# Patient Record
Sex: Female | Born: 1975
Health system: Midwestern US, Academic
[De-identification: ages and names within clinical notes are randomized; demographics above are authoritative.]

---

## 2018-11-28 ENCOUNTER — Encounter: Admit: 2018-11-28 | Discharge: 2018-11-28

## 2018-11-28 NOTE — Telephone Encounter
Introduction:  Will you need interpreter services? No  What prompted you to look into living donation? family friend  What is your relationship to the recipient? friend  Is your family aware of your consideration of living donation? Yes  Do you feel your family is supportive of your consideration of living donation? Yes  Is the recipient aware that you are considering living donation? Yes  If not, do you wish to remain anonymous? No  -Discuss confidentiality process in that is a best effort, but no guarantees-      Race, Sex and Body Habitus:  What race do you identify as? white  What gender do you identify with? female  What gender was assigned to you at birth? female  What is your Height? 5 ' 7  What is your weight? 230  Calculated BMI:  36    To proceed forward with evaluation: 35 or less  If approved to be donor, BMI of 30 or less    Patient will call back when BMI < 35

## 2019-04-21 IMAGING — CT Abdomen^ABD_PEL_W (Adult)
1 series · 15 of 32 positions shown, 19 images · IV contrast (OPTIRAY)
Comparison: none

Procedure(s): CT abdomen pelvis w con

EXAM:  CT of the abdomen and pelvis with 100 cc of Optiray
350 contrast.
INDICATIONS: Epigastric abdominal pain. Pancreatitis.
DISCLAIMER: Radiation reduction techniques were performed to
reduce the radiation dose as low as reasonably achievable
(PINKY).

[Series 2: abd-pel_w · axial · 0.98mm/px · z∈[+402,+847]mm · 15 of 100 slices shown, 19 images]
[im 7/100  soft-tissue]
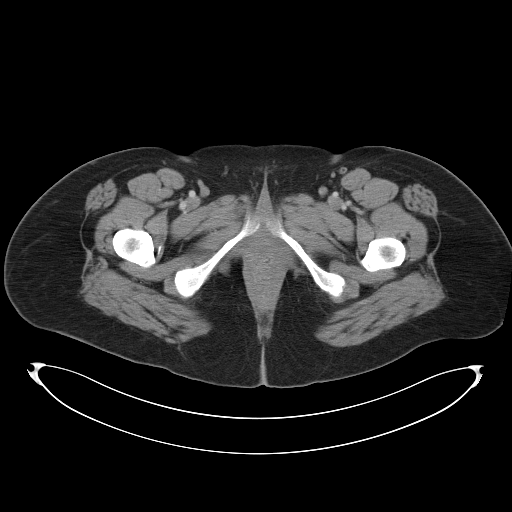
[im 7/100  bone]
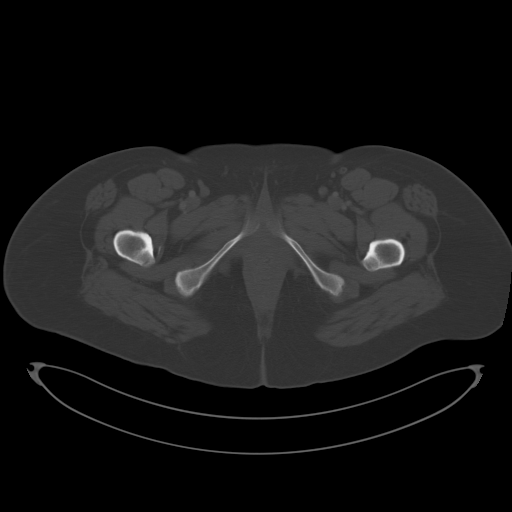
[im 13/100  soft-tissue]
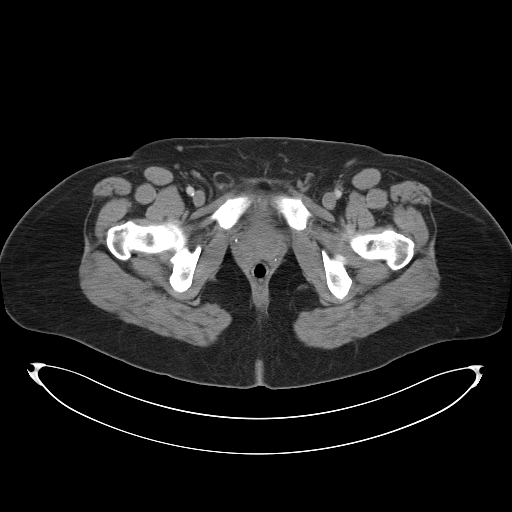
[im 20/100  soft-tissue]
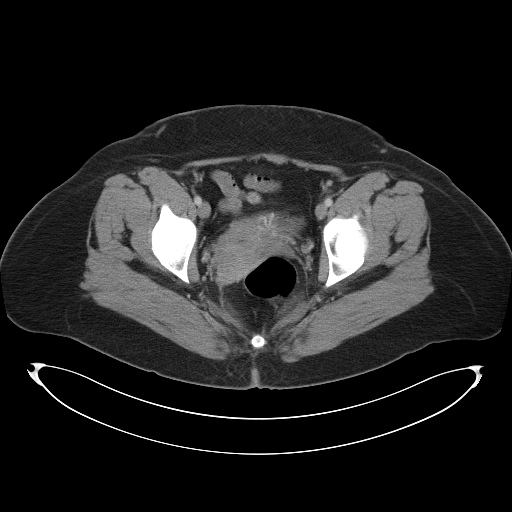
[im 29/100  soft-tissue]
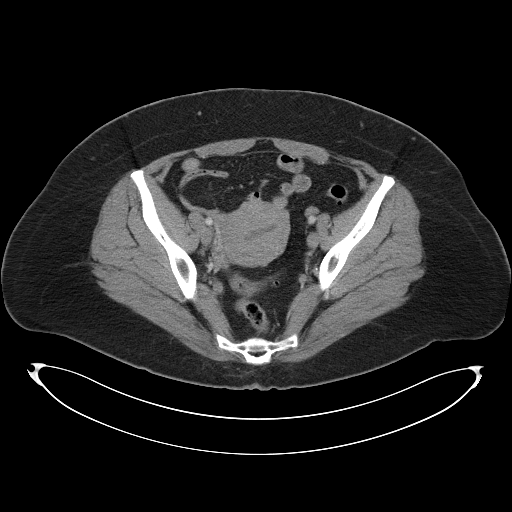
[im 36/100  soft-tissue]
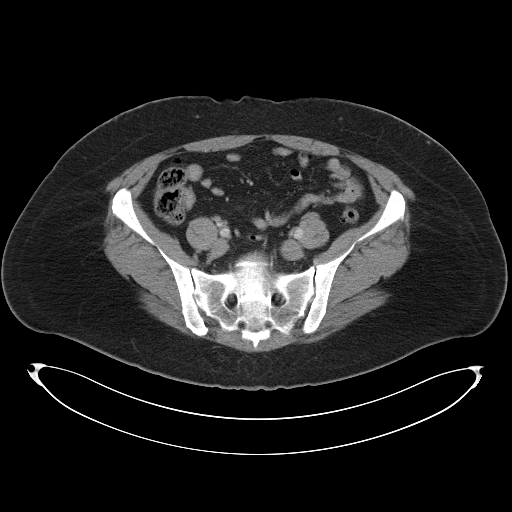
[im 42/100  soft-tissue]
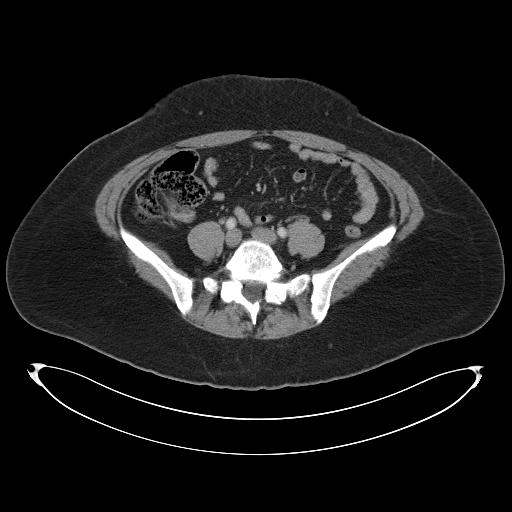
[im 52/100  soft-tissue]
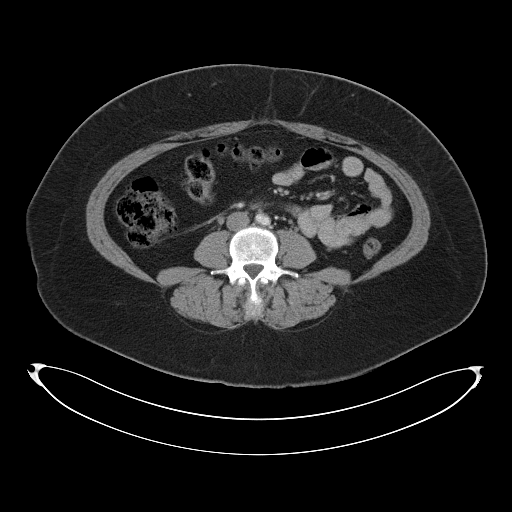
[im 58/100  soft-tissue]
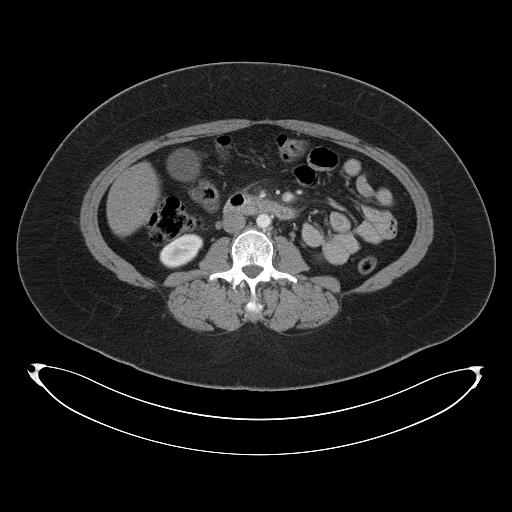
[im 64/100  soft-tissue]
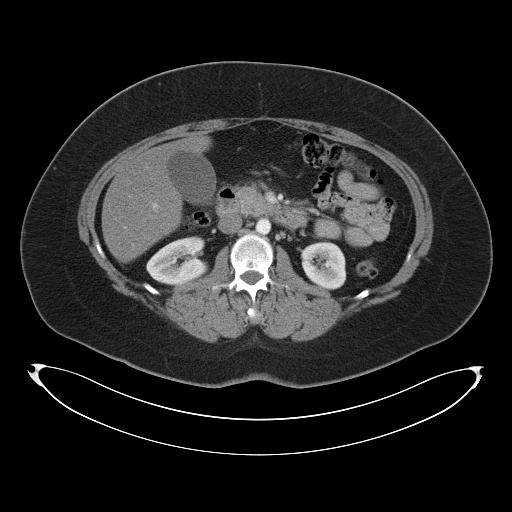
[im 64/100  bone]
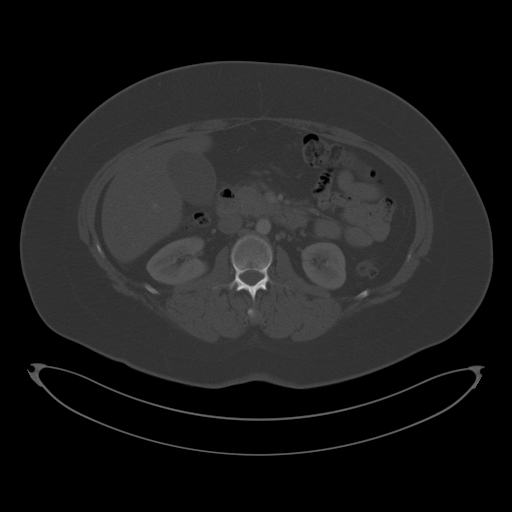
[im 71/100  soft-tissue]
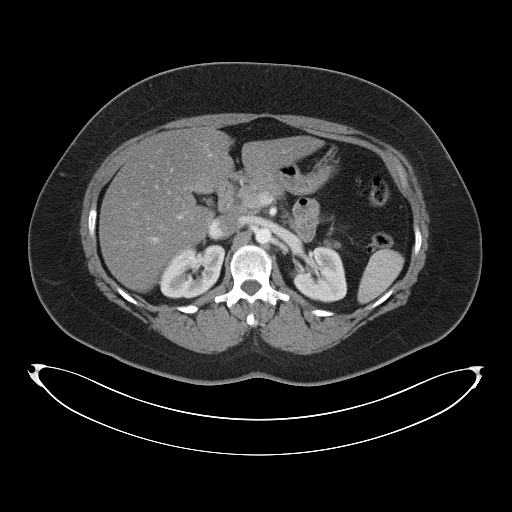
[im 80/100  soft-tissue]
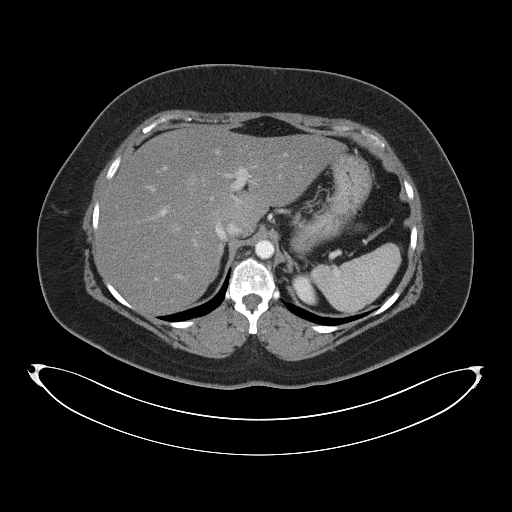
[im 87/100  soft-tissue]
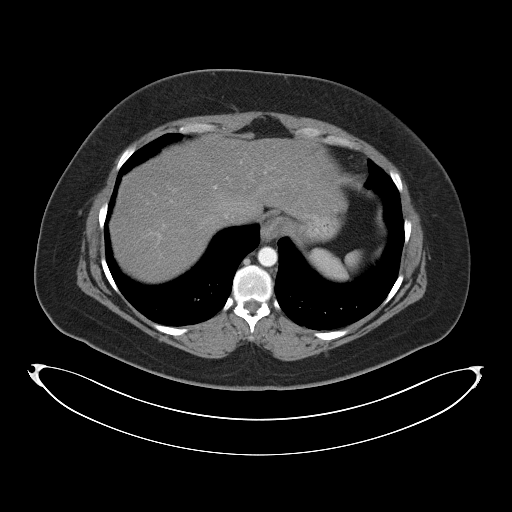
[im 87/100  lung]
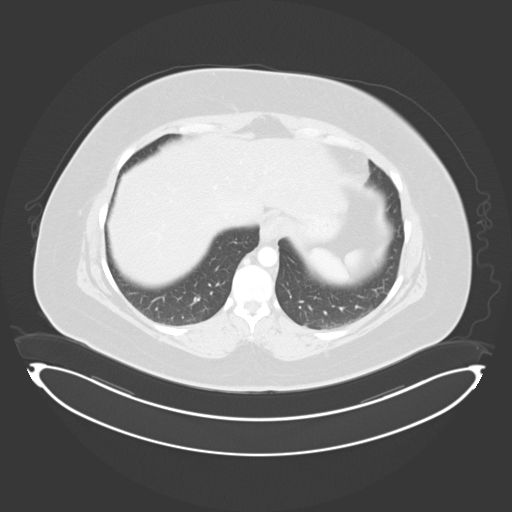
[im 90/100  lung]
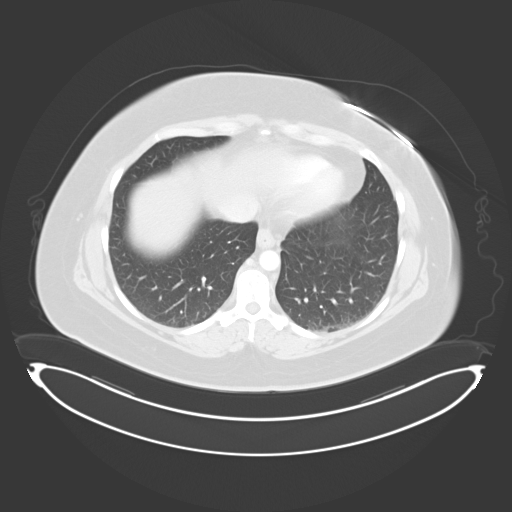
[im 93/100  soft-tissue]
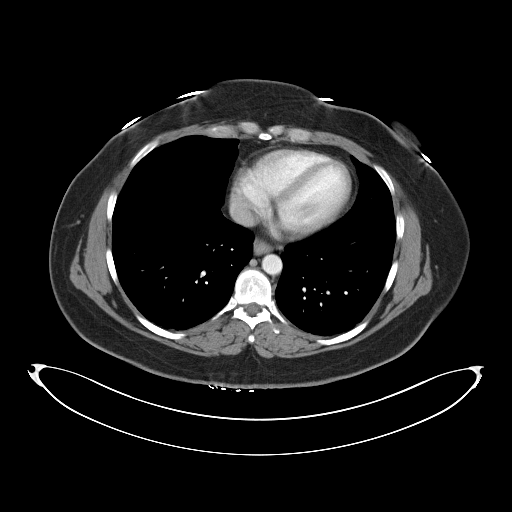
[im 93/100  lung]
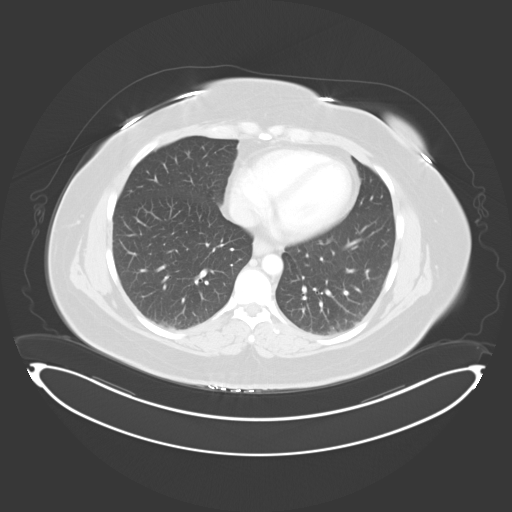
[im 96/100  lung]
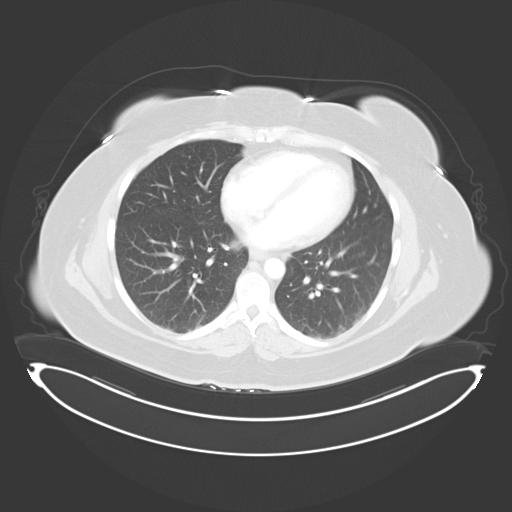

[15 of 32 positions shown; findings below may reference images not displayed]

FINDINGS: Comparison 12/22/2016

Lung bases: The lung bases are clear.

Liver: Hepatic steatosis. No focal lesions are identified.

Gallbladder: The gallbladder is unremarkable. There is no
bile duct dilatation.

Spleen: The spleen is normal in size without focal lesions.

Midline structures: There is mild fat stranding along the
pancreatic head and uncinate process of the pancreas
consistent with pancreatitis. No lesions are identified. The
stomach is grossly unremarkable.

Kidneys: The kidneys are normal in size. There are no solid
masses or hydronephrosis.

Aorta and central retroperitoneum: There is mild
atheromatous plaquing of the distal aorta. No central
retroperitoneal adenopathy is identified.

Pelvic structures: The uterus is normal in size. There is a
1.5 cm right ovarian cyst. No solid adnexal masses are
present.

Small bowel: The small bowel is unremarkable. There is no
bowel wall thickening or mechanical bowel obstruction.

Large bowel: There is sigmoid diverticulosis without
diverticulitis.

Appendix: The appendix is visualized and is grossly
unremarkable.

Extra: There is no free fluid, free air, or abscess.

Bones: There is degenerative disc disease at L5-S1.
IMPRESSION: Acute pancreatitis. There is no bile duct dilatation,
pseudocyst formation or splenic vein thrombosis.

Hepatic steatosis, unchanged from the prior study.

## 2019-04-24 IMAGING — US ABDLM
1 series · 14 of 24 positions shown · non-contrast
Comparison: none

Procedure(s): US abdomen [id number]

ULTRASOUND ABDOMEN LIMITED
CLINICAL DATA: Right upper quadrant pain.

[Series 1: us abdomen (id) · 14 of 24 slices shown]
[im 1/24]
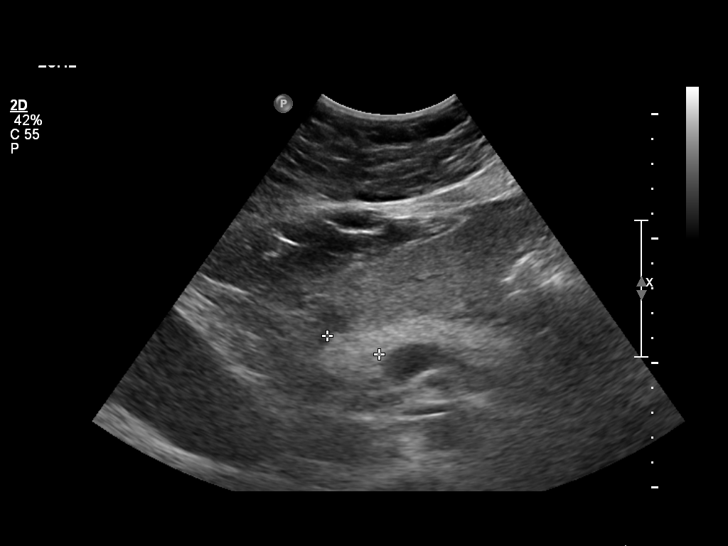
[im 3/24]
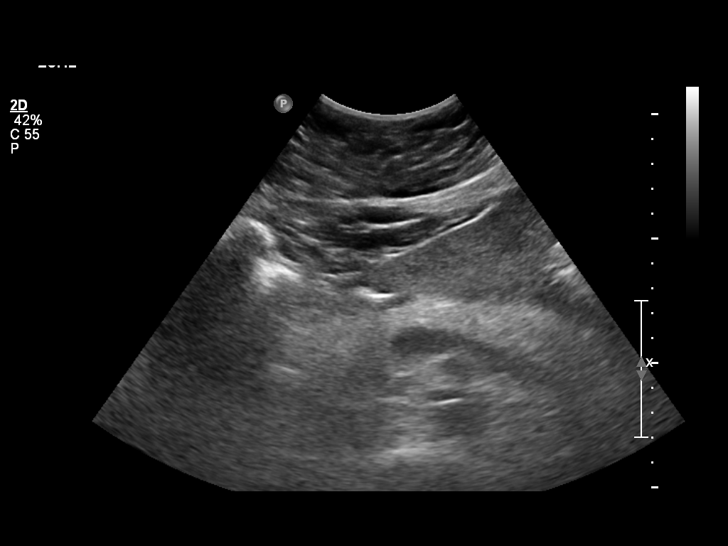
[im 5/24]
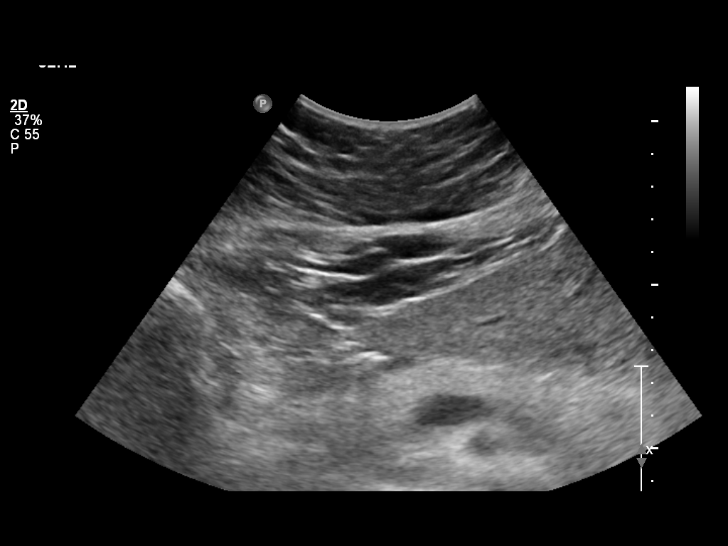
[im 7/24]
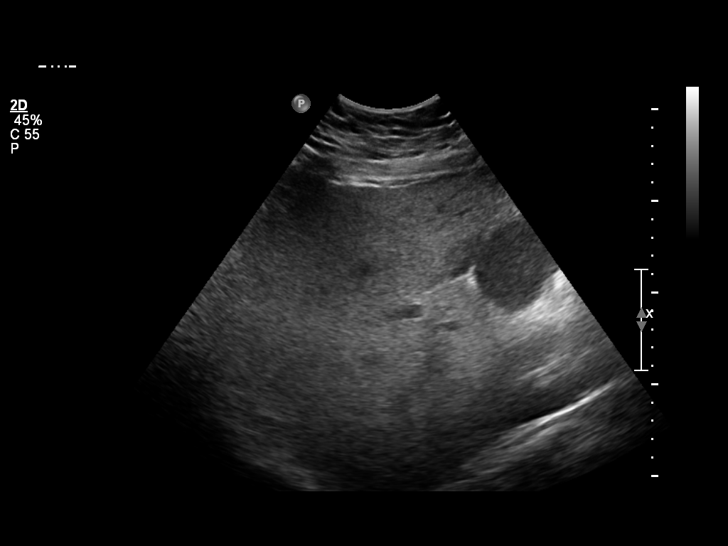
[im 8/24]
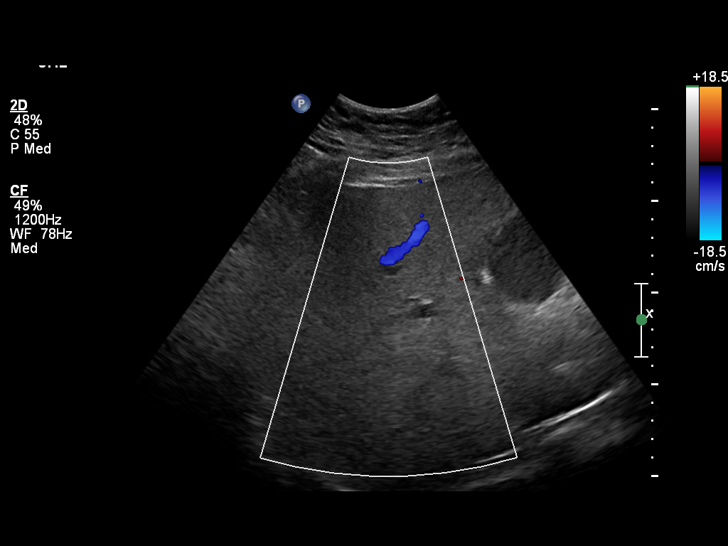
[im 10/24]
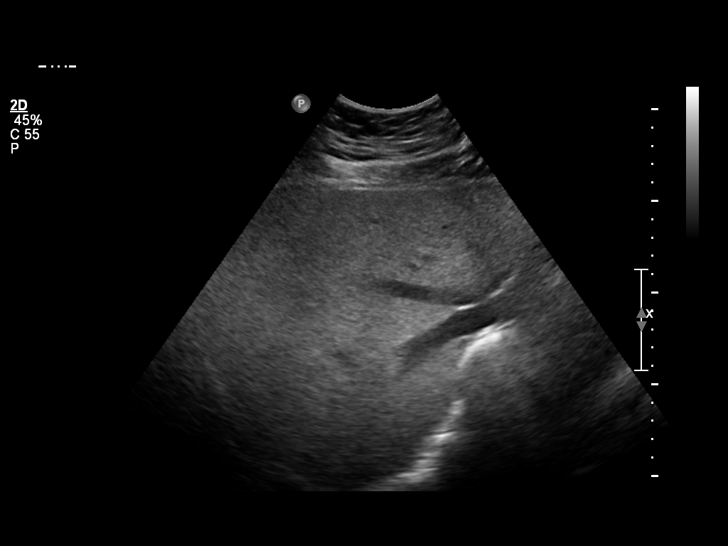
[im 12/24]
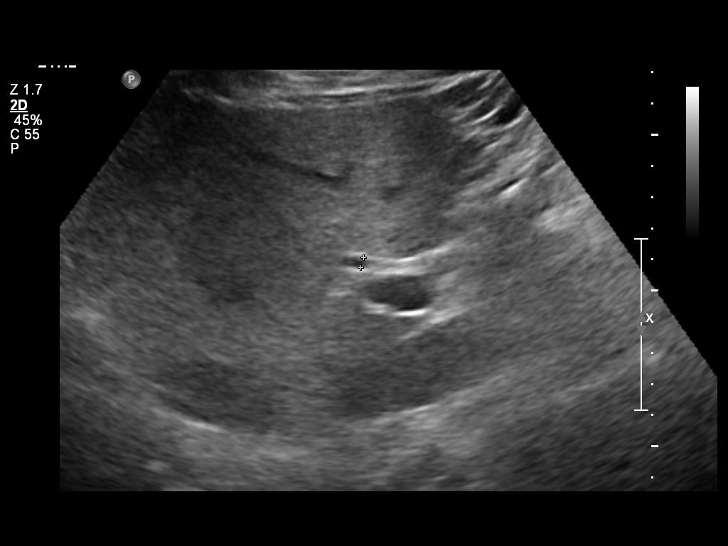
[im 13/24]
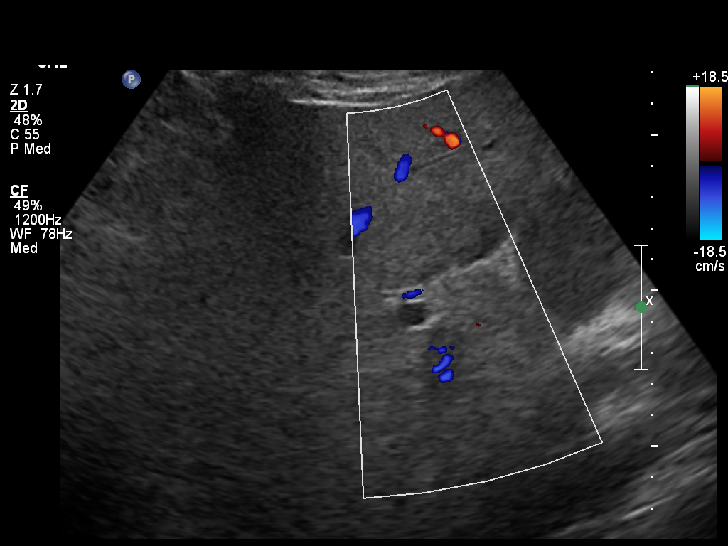
[im 15/24]
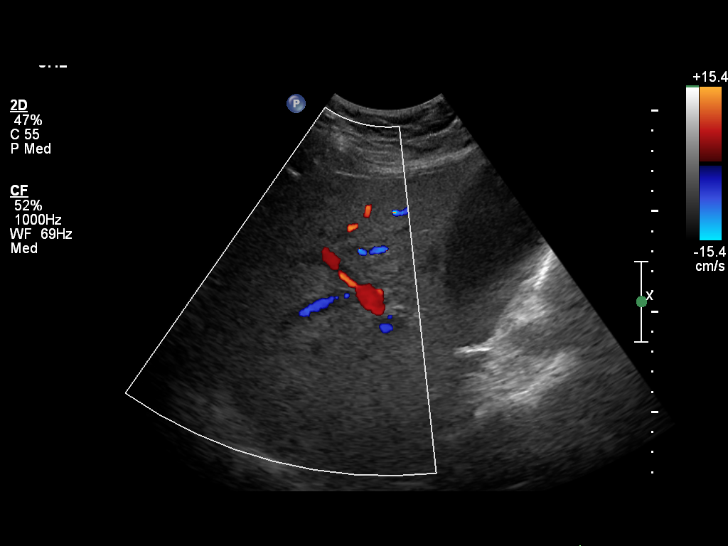
[im 17/24]
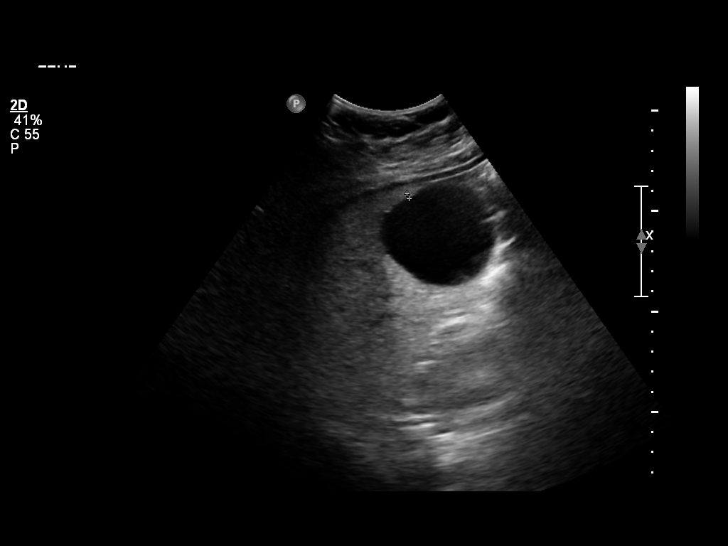
[im 19/24]
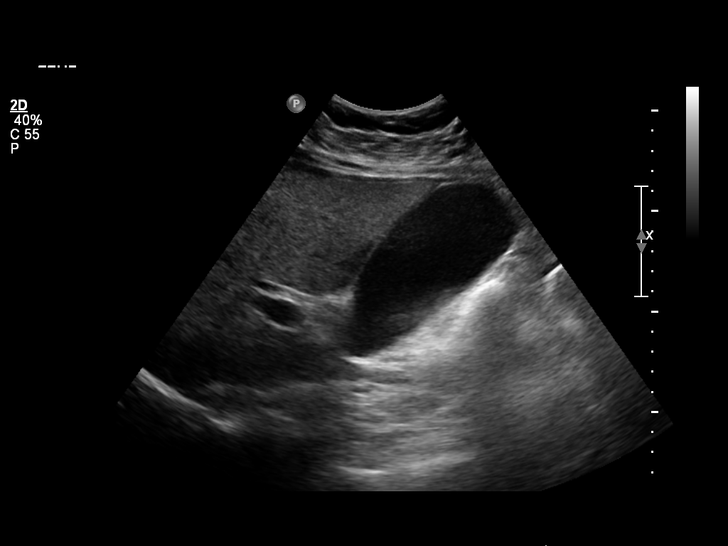
[im 20/24]
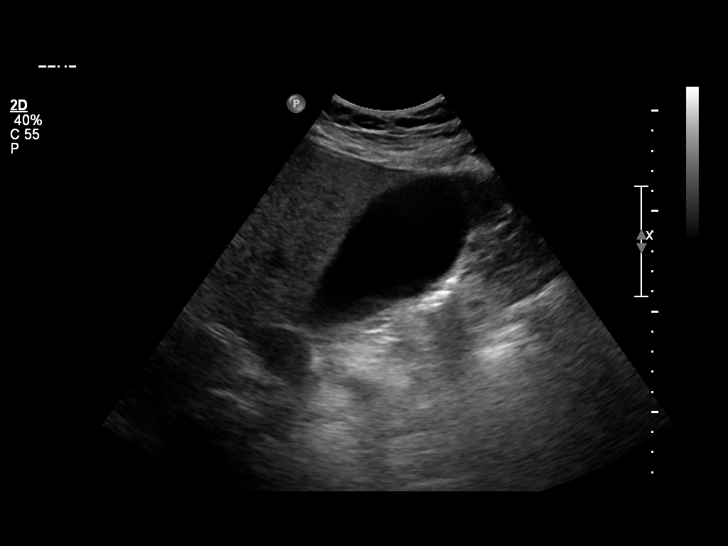
[im 22/24]
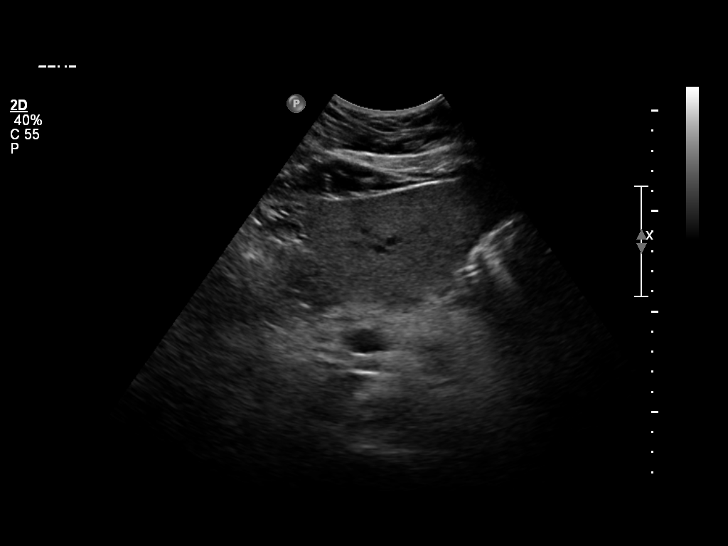
[im 24/24]
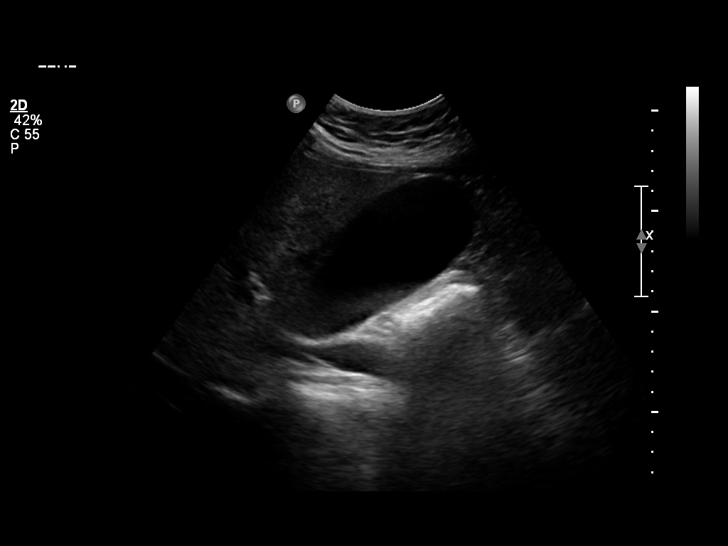

[14 of 24 positions shown; findings below may reference images not displayed]

FINDINGS: The liver is enlarged measuring 18.3 cm in length, no
evidence of hepatic lesions. The gallbladder has normal size
and wall thickness.  No gallstones are present. Mild probe
tenderness is elicited.  The common duct measures 3.3 mm.
The pancreas is unremarkable.
IMPRESSION: 1. Mild hepatomegaly.
2. Mild tenderness during gallbladder examination, no
evidence of cholelithiasis, normal gallbladder wall
thickness.

## 2020-01-01 IMAGING — DX C-Spine
3 series · 3 of 3 positions shown · non-contrast
Comparison: none

Procedure(s): XR cervical spine 3V

THREE VIEWS OF THE CERVICAL SPINE:
CLINICAL HISTORY: Pain.

[left lateral]
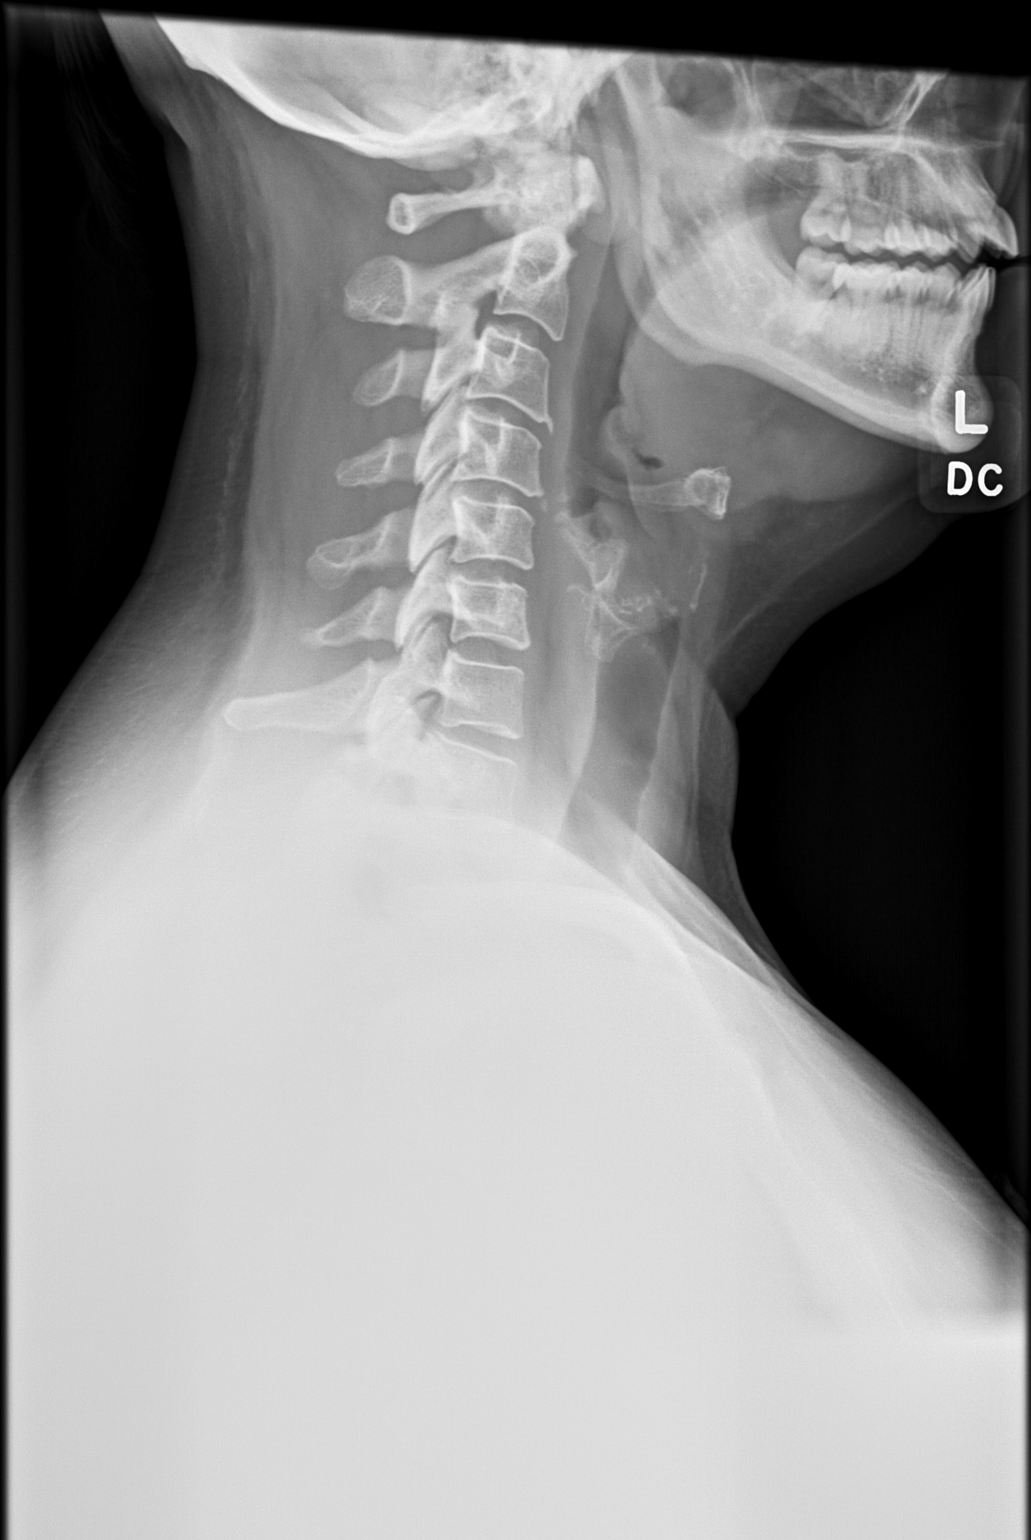

[AP (1 of 2)]
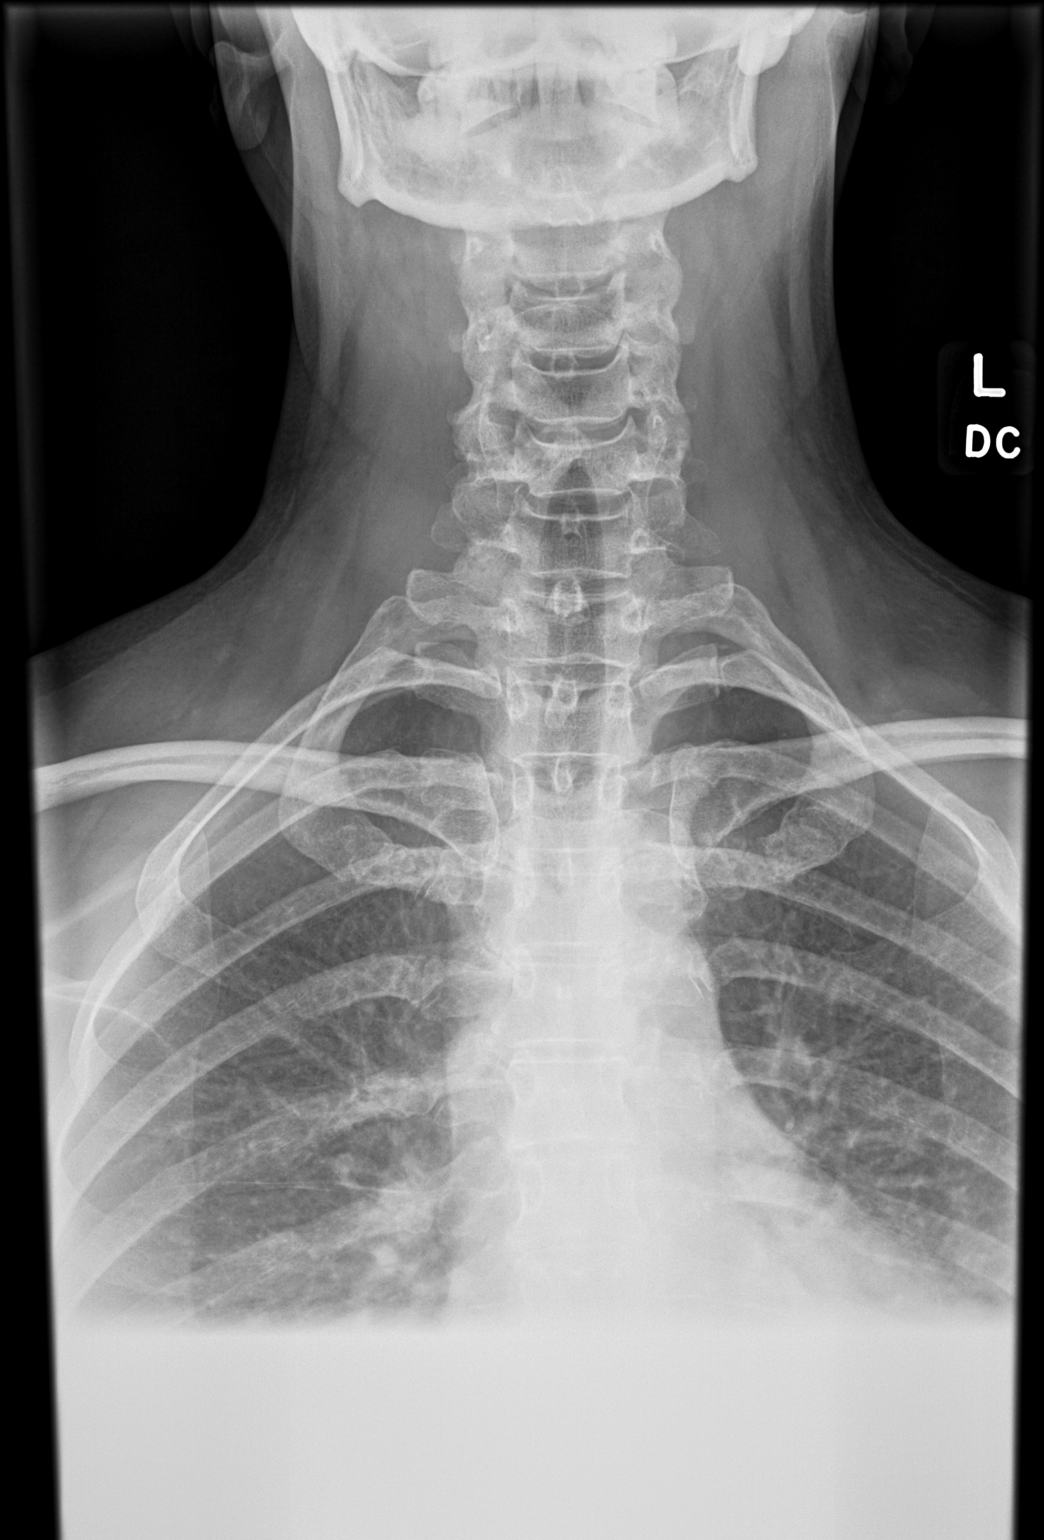

[AP (2 of 2)]
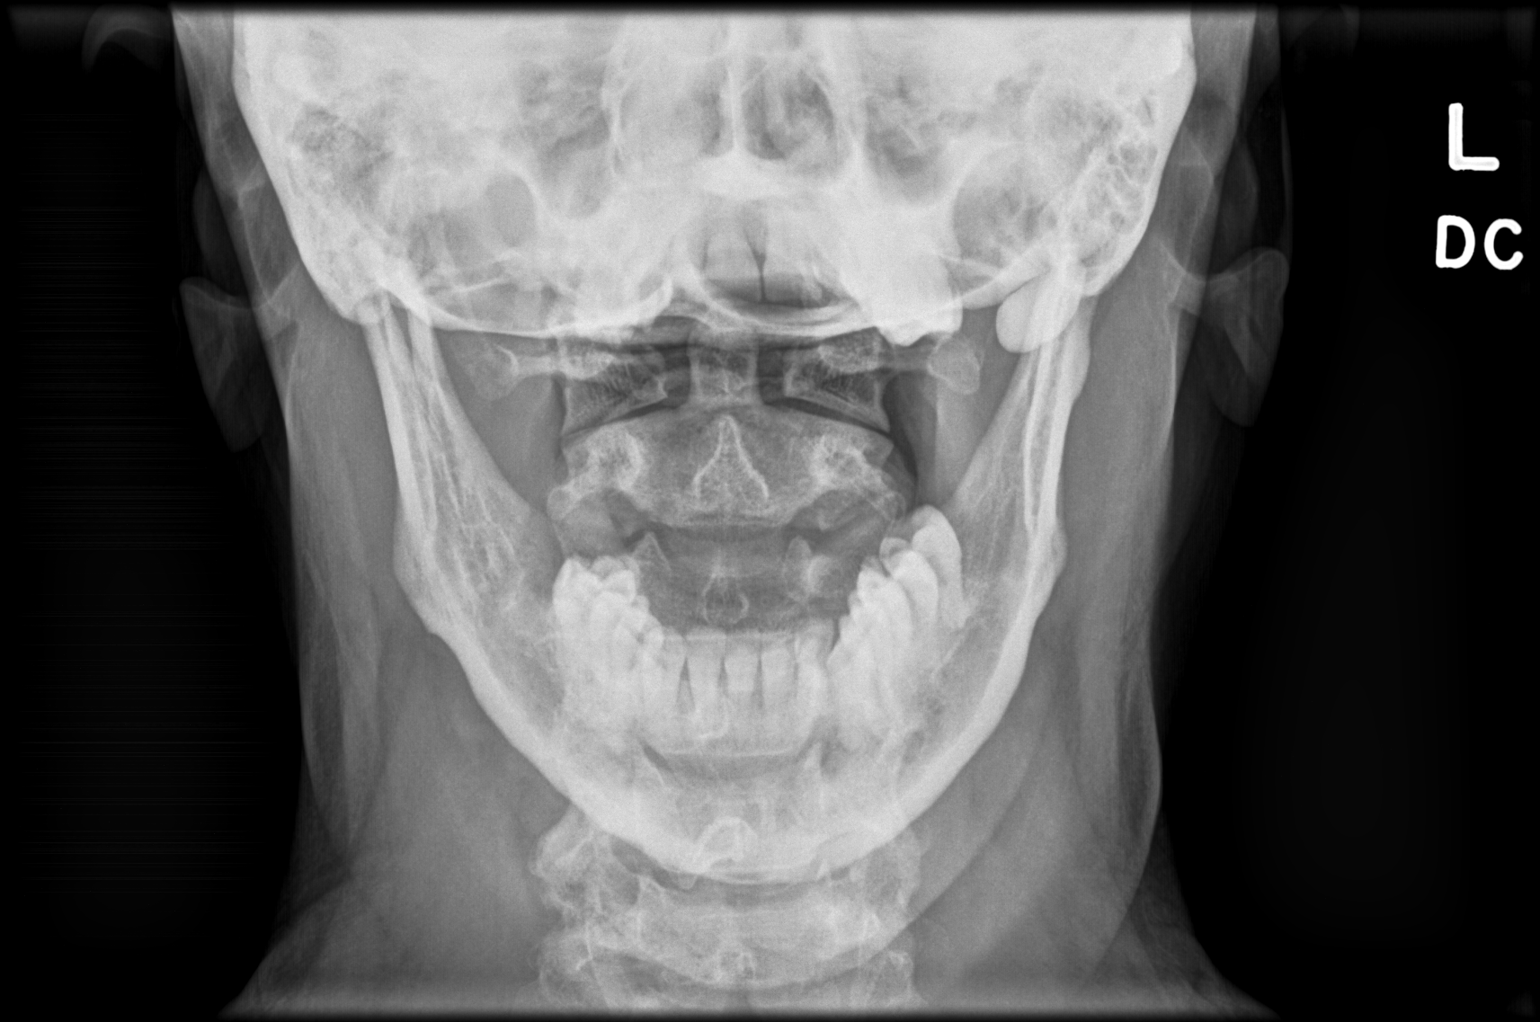

[3 of 3 positions shown; findings below may reference images not displayed]

FINDINGS: The cervical vertebral body heights are maintained. There is
reversal of the cervical curvature. There are developing
ventral osteophytes at C3-C4 and C4-C5. The lateral masses
are aligned. The dens appears intact.
IMPRESSION: 1. Reversal of the cervical curvature and associated
anterior degenerative disc disease at C3-C4 and C4-C5.

## 2020-02-20 IMAGING — MR SPCERVWO
4 series · 23 of 48 positions shown · non-contrast
Comparison: none

Procedure(s): MR cervical spine wo con

MRI OF THE CERVICAL SPINE WITHOUT CONTRAST DATED 02/20/2020:
INDICATION: Neck pain. Spurs at C3-4 and C4-5.

[Series 3: T2 · sagittal · 3.5mm · 0.43mm/px · 8 of 13 slices shown]
[im 1/13]
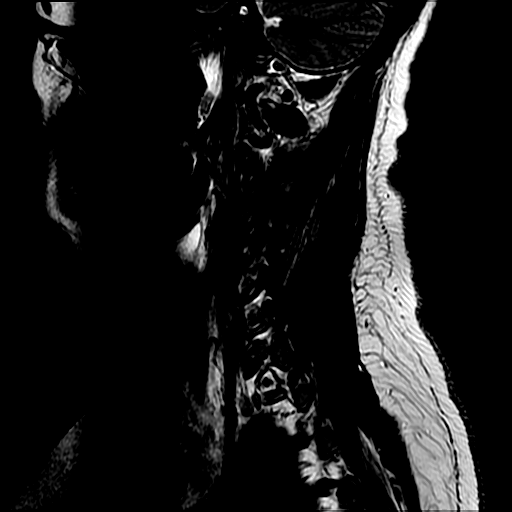
[im 2/13]
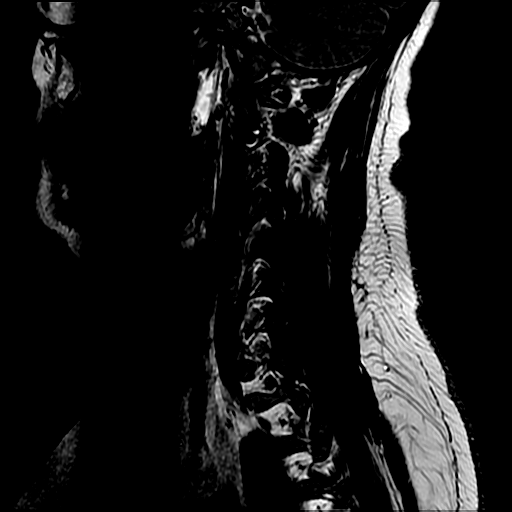
[im 4/13]
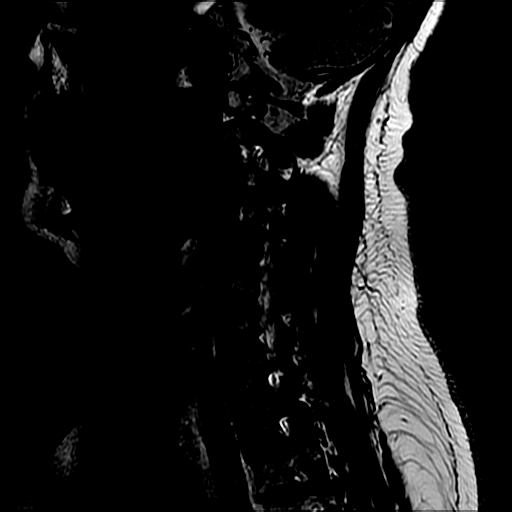
[im 6/13]
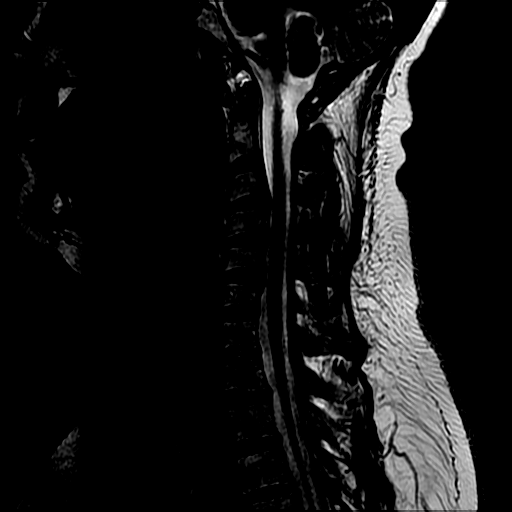
[im 7/13]
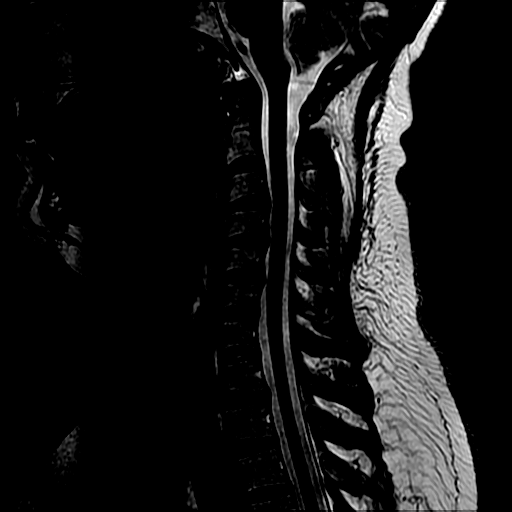
[im 9/13]
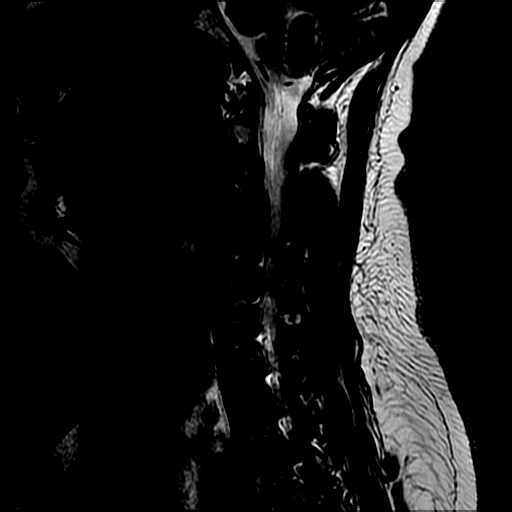
[im 11/13]
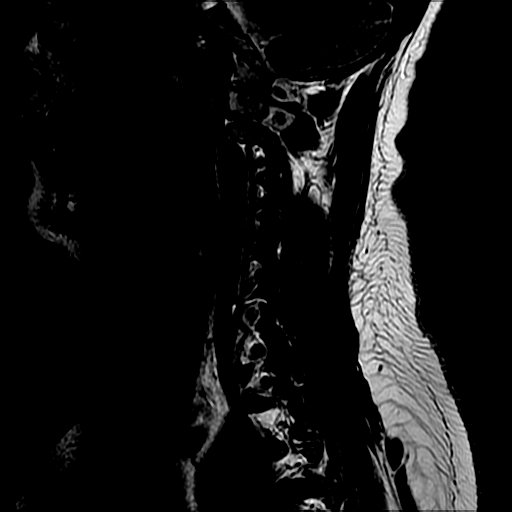
[im 13/13]
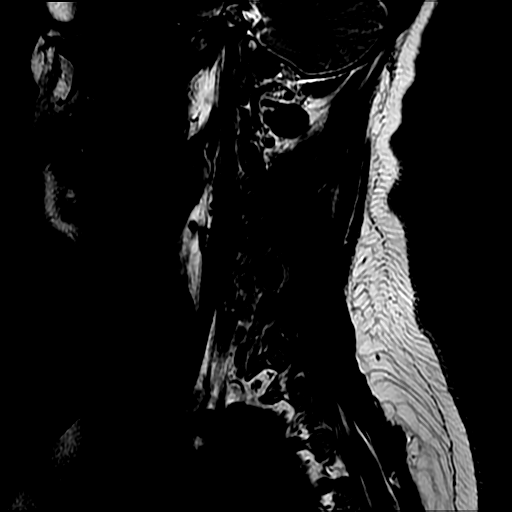

[Series 4: T1 · sagittal · 3.5mm · 0.43mm/px · 9 of 13 slices shown]
[im 1/13]
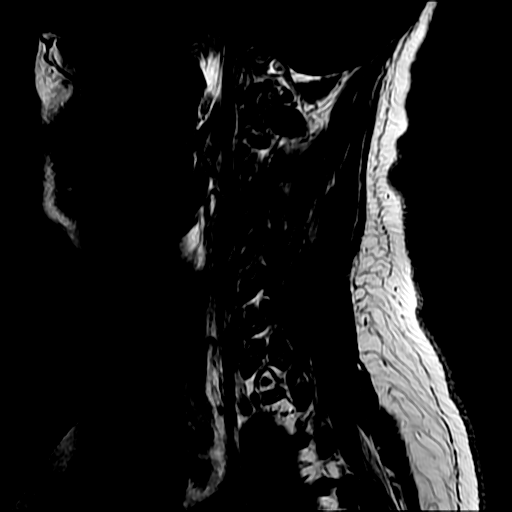
[im 2/13]
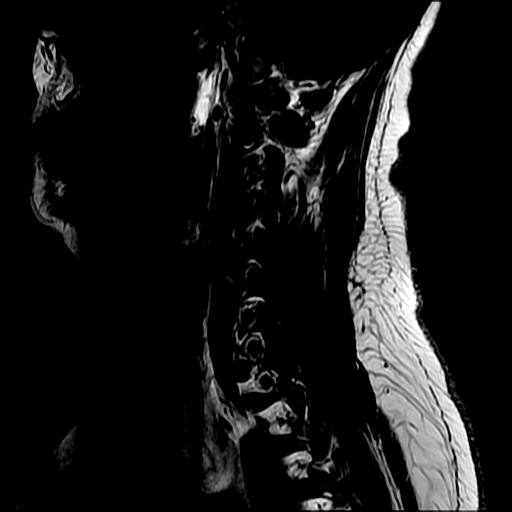
[im 4/13]
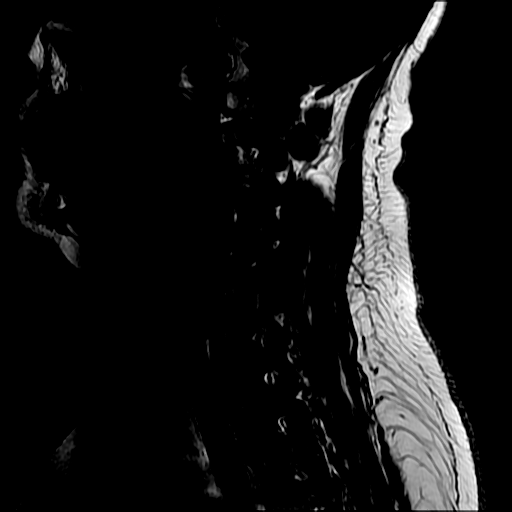
[im 5/13]
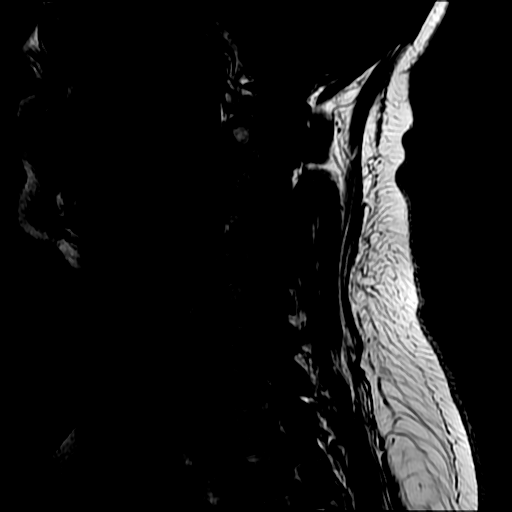
[im 7/13]
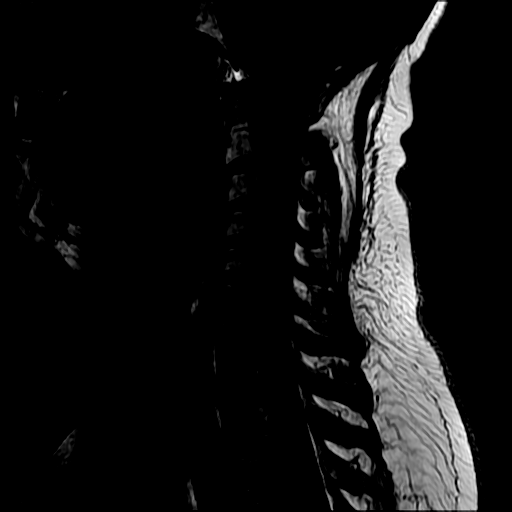
[im 8/13]
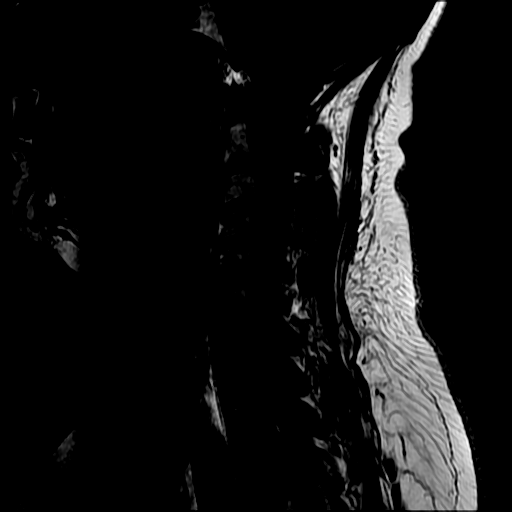
[im 10/13]
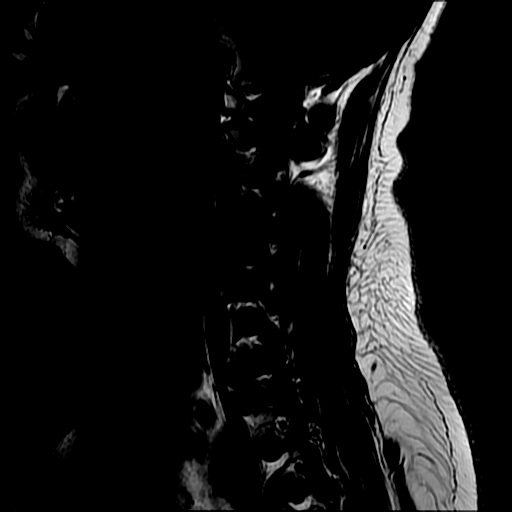
[im 11/13]
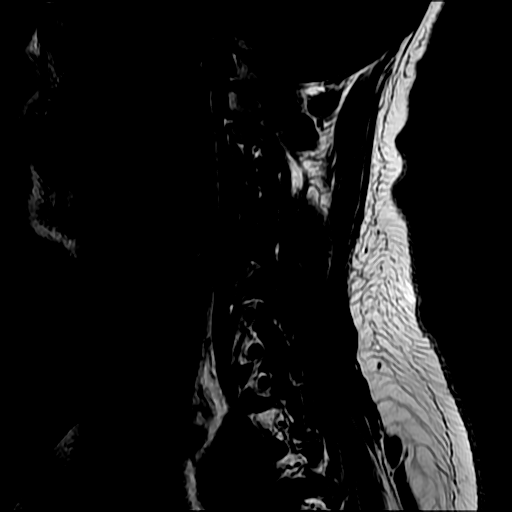
[im 13/13]
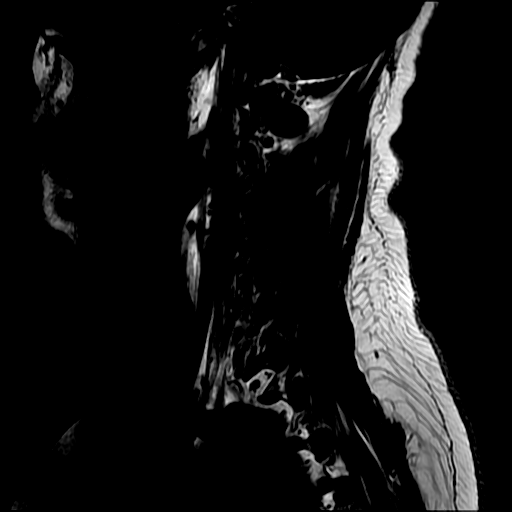

[Series 5: STIR · sagittal · 3.5mm · 0.86mm/px · 3 of 13 slices shown]
[im 2/13]
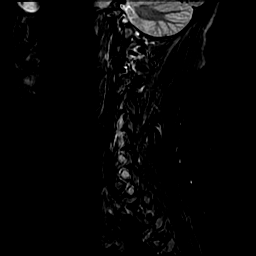
[im 7/13]
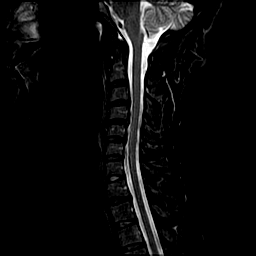
[im 11/13]
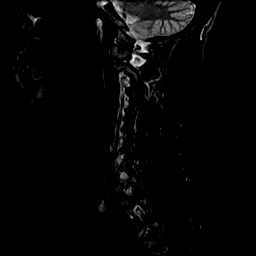

[Series 6: T2-star · axial · 3.0mm · 0.70mm/px · z∈[-86,+1]mm · 3 of 32 slices shown]
[im 5/32]
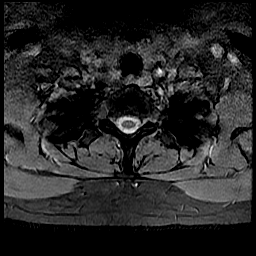
[im 17/32]
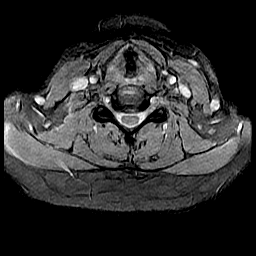
[im 27/32]
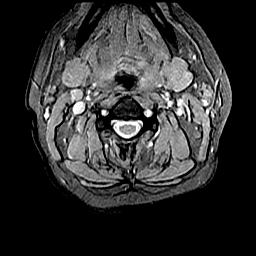

[23 of 48 positions shown; findings below may reference images not displayed]

COMPARISON STUDIES:  Cervical spine radiographs 01/01/2020.

TECHNIQUE/FINDINGS:

Sagittal T2, STIR and T1 imaging was obtained the cervical
spine. In addition, axial gradient echo imaging was obtained
through the disc spaces from C2-T2.

The sagittal images demonstrate slight reversal the normal
cervical lordosis as seen previously. This is most commonly
related to muscle spasm. There is otherwise normal vertebral
body height and marrow signal intensity. Mild degenerative
disc signal intensity is present at C3-4 and C4-5 with
minimal associated disc space loss. The visualized portions
of the posterior fossa and spinal cord are of normal
caliber, contour and signal intensity.

The axial images at C2-3 do not demonstrate central spinal
stenosis or neural foraminal narrowing.

C3-4 there is generalized disc bulging and posterior lateral
uncovertebral joint osteophyte formation without significant
central spinal stenosis or neural foraminal narrowing.

C4-5 there is a broad-based central disc protrusion which
mildly effaces the CSF anterior to the spinal cord. AP
dimension of the thecal sac remains 10 mm. There is mild
right-sided neural foraminal narrowing. No significant left
neural foraminal narrowing.

C5-6 there is mild generalized disc bulging and posterior
lateral uncovertebral joint osteophyte formation with mild
right neural foraminal narrowing. No left neural foraminal
narrowing. No central spinal stenosis.

C6-7 no central spinal stenosis or neural foraminal
narrowing.

C7-T1 no central spinal stenosis or neural foraminal
narrowing.

T1-T2 no central spinal stenosis or neural foraminal
narrowing.
IMPRESSION: Mild cervical spondylosis as discussed above. Most
significant disease is present at C4-5 and C5-6.

## 2020-03-24 IMAGING — CT CT abdomen pelvis w con
2 of 4 series · 15 of 46 positions shown, 17 images · IV contrast (CE)
Comparison: March 22, 2019.

Procedure(s): CT abdomen pelvis w con

CT ABDOMEN AND PELVIS with IV CONTRAST
INDICATION: Abdominal pain.
PROCEDURE:  Routine CT scan of the abdomen and pelvis was
performed following administration of 100 cc of Omni 350
intravenous contrast.Automated exposure control (AEC) dose
reduction technique was utilized.

[Series 3: soft tissue body 5.0 ce · axial · 0.92mm/px · z∈[+588,+1118]mm · 12 of 124 slices shown, 14 images]
[im 9/124  soft-tissue]
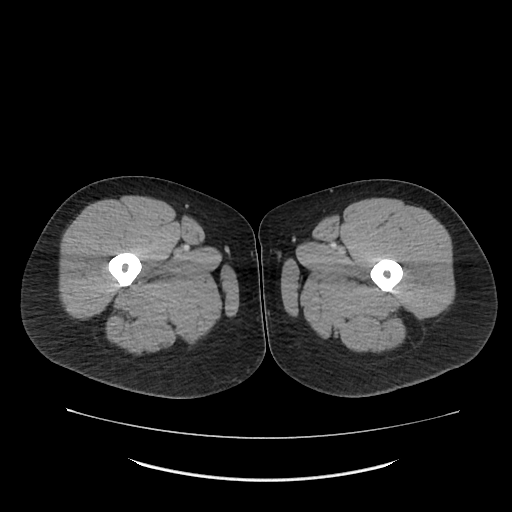
[im 9/124  bone]
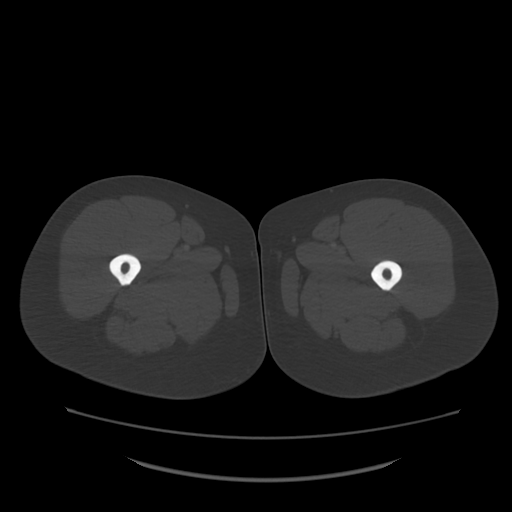
[im 17/124  soft-tissue]
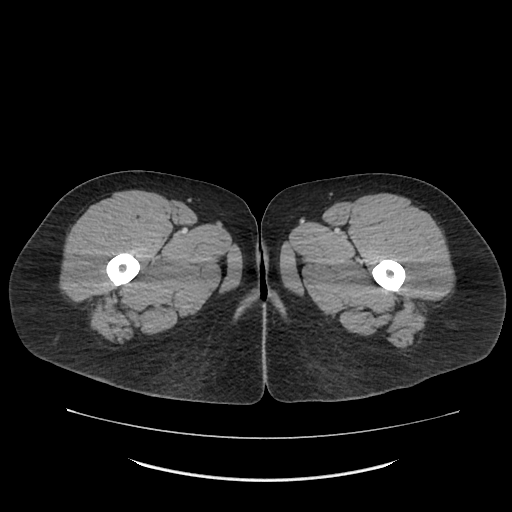
[im 25/124  soft-tissue]
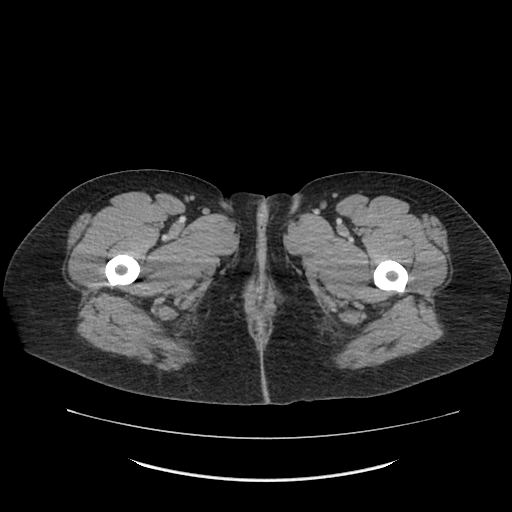
[im 42/124  soft-tissue]
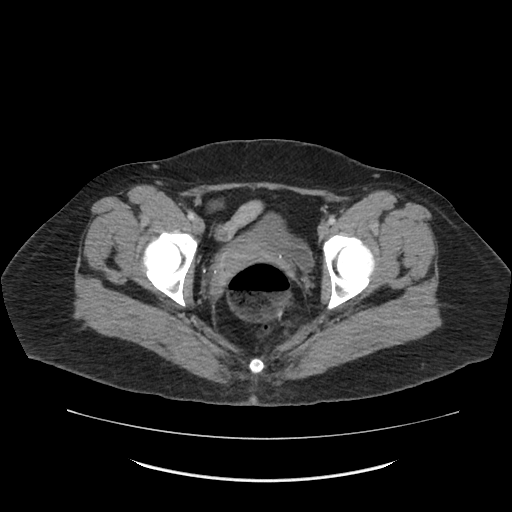
[im 50/124  soft-tissue]
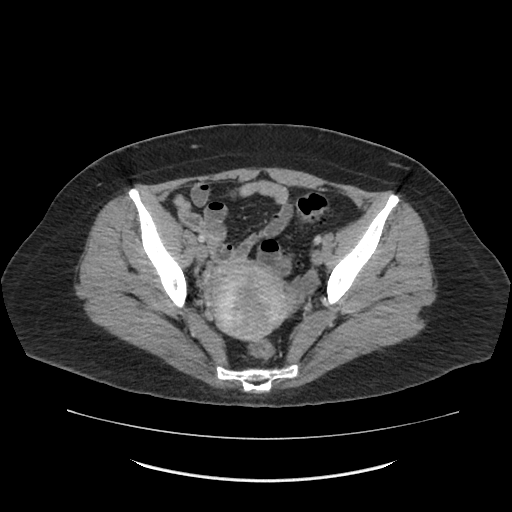
[im 58/124  soft-tissue]
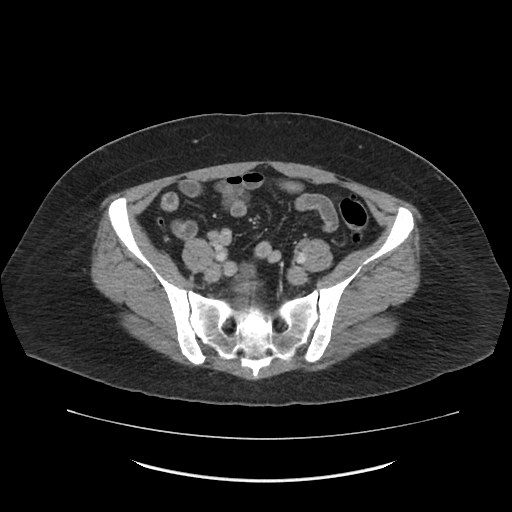
[im 66/124  soft-tissue]
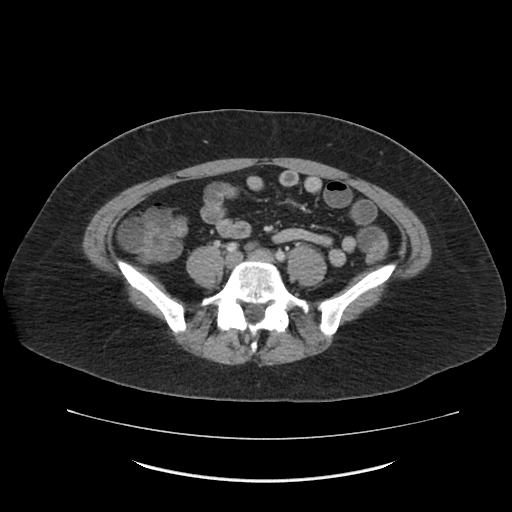
[im 74/124  soft-tissue]
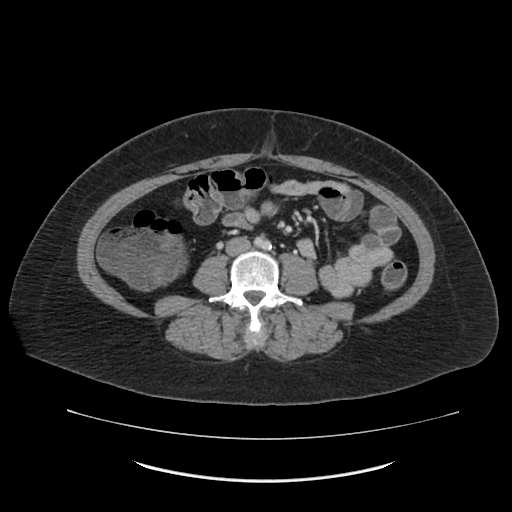
[im 83/124  soft-tissue]
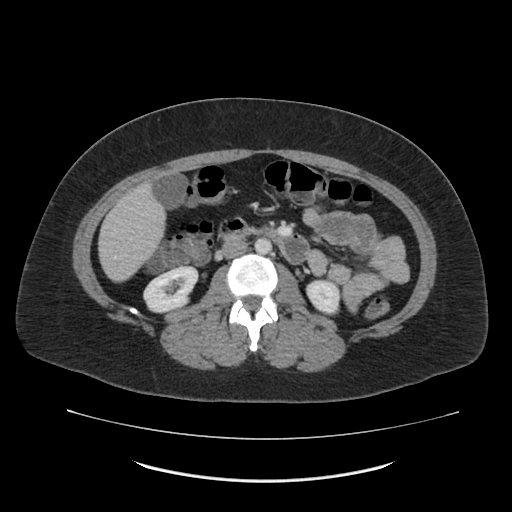
[im 83/124  bone]
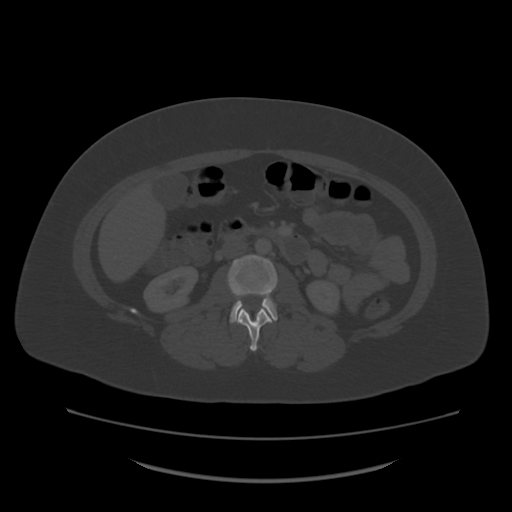
[im 99/124  soft-tissue]
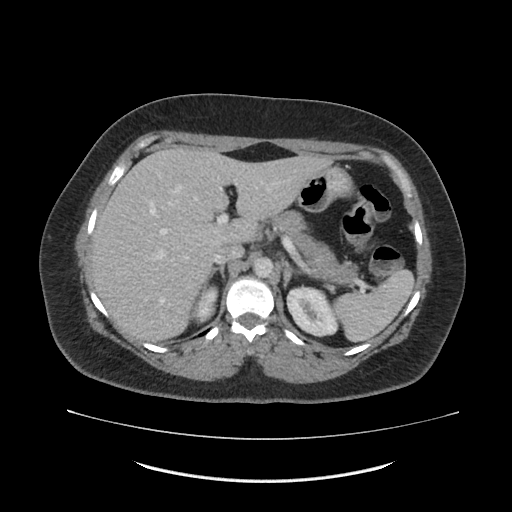
[im 107/124  soft-tissue]
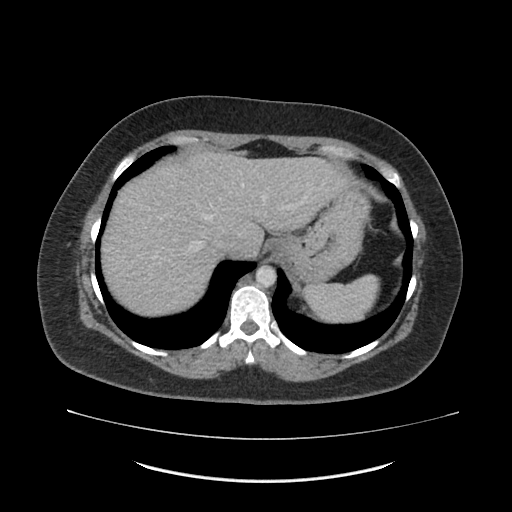
[im 115/124  soft-tissue]
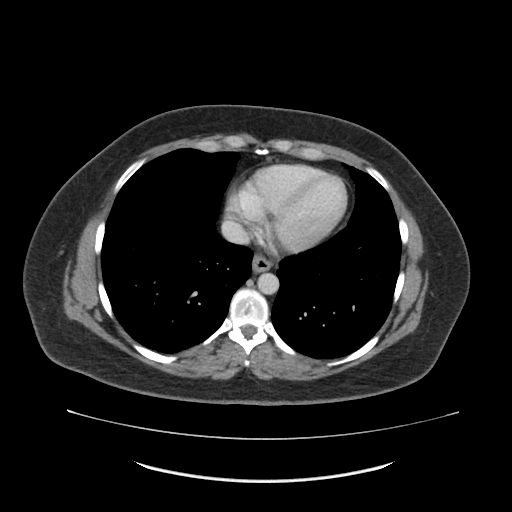

[Series 7: body 5.000 ce · coronal · 0.92mm/px · 3 of 68 slices shown]
[im 23/68  soft-tissue]
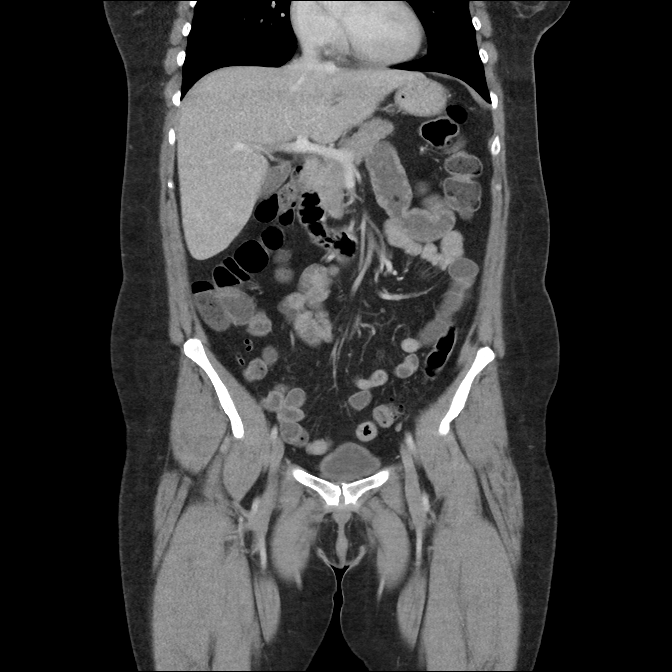
[im 30/68  soft-tissue]
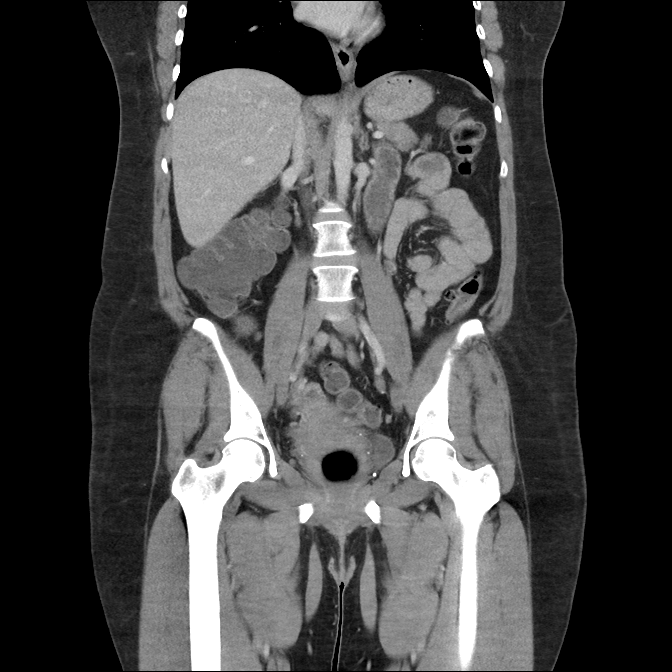
[im 38/68  soft-tissue]
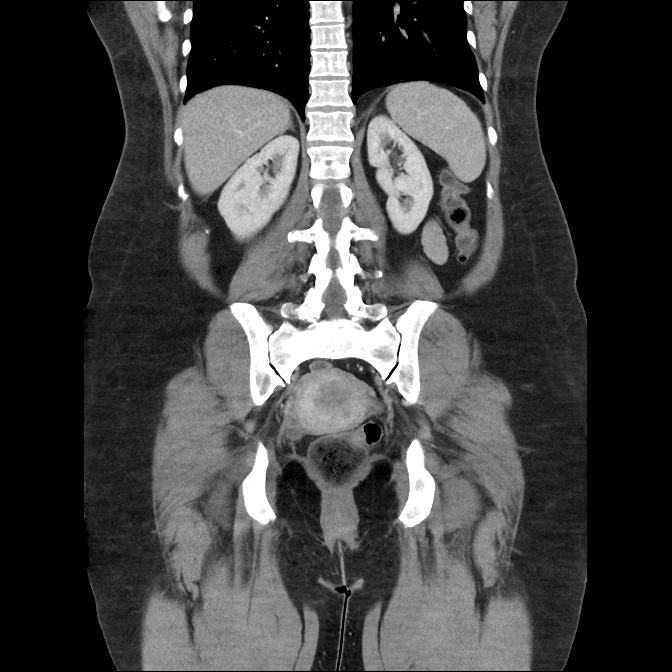

[15 of 46 positions shown; findings below may reference images not displayed]

CT ABDOMEN: The lung bases are clear.  No pleural or
pericardial effusion is present.  The liver and spleen are
normal is size and contour. No focal liver lesions or
intrahepatic ductal dilatation is seen.  The gallbladder is
normal. The pancreas, adrenal glands and kidneys are normal.
No abdominal adenopathy or free fluid is seen.  No thickened
or dilated bowel loops are identified.

CT PELVIS:  The ureters and urinary bladder are grossly
normal.  Fluid is seen within the endometrial canal.  No
pelvic adenopathy or free fluid is seen. No thickened or
dilated bowel loops are identified.  The appendix is normal.
Sigmoid diverticulosis without evidence of diverticulitis.
Visualized osseous structures appear intact.
IMPRESSION: 1. Colonic diverticulosis without evidence of
diverticulitis.
2. Fluid within the endometrial canal, correlate with
patient's menstrual cycle.

The findings in this study were monitored by direct
radiology, with results communicated to the emergency
department.

## 2021-01-12 IMAGING — US US pelvic [ID]
1 series · 12 of 12 positions shown · non-contrast
Comparison: None available

RADIOLOGIC EXAM:
Transabdominal pelvic ultrasound
INDICATION: Recent miscarriage

[Series 1: us pelvic (id) · 12 of 12 slices shown]
[im 1/12]
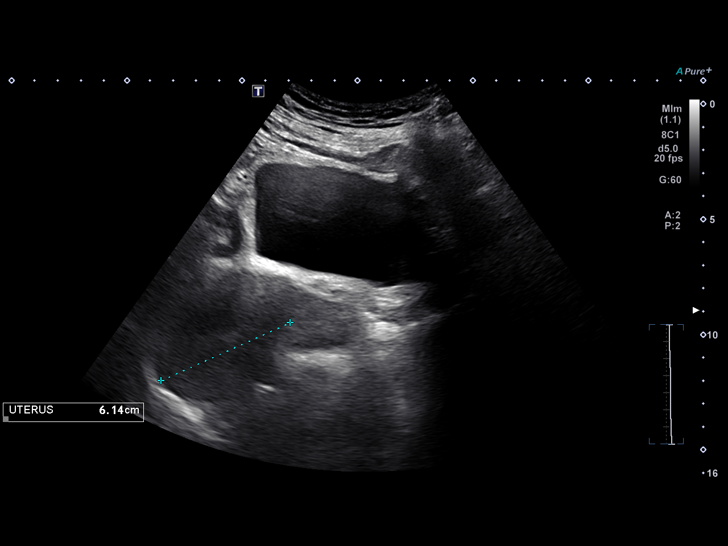
[im 2/12]
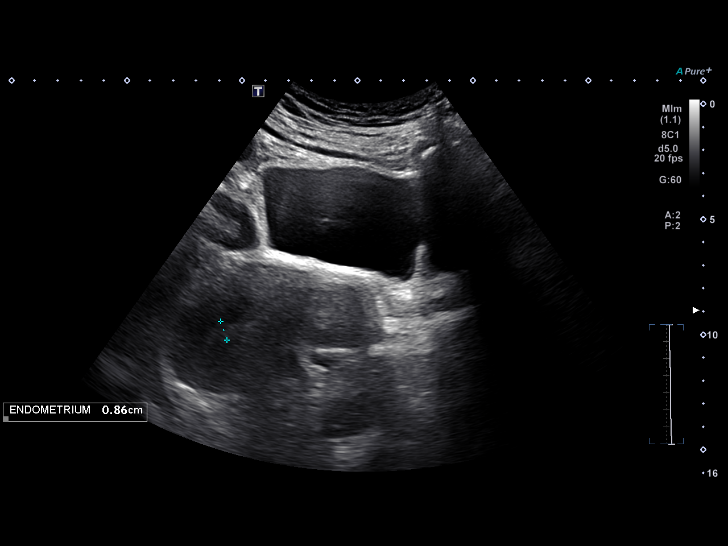
[im 3/12]
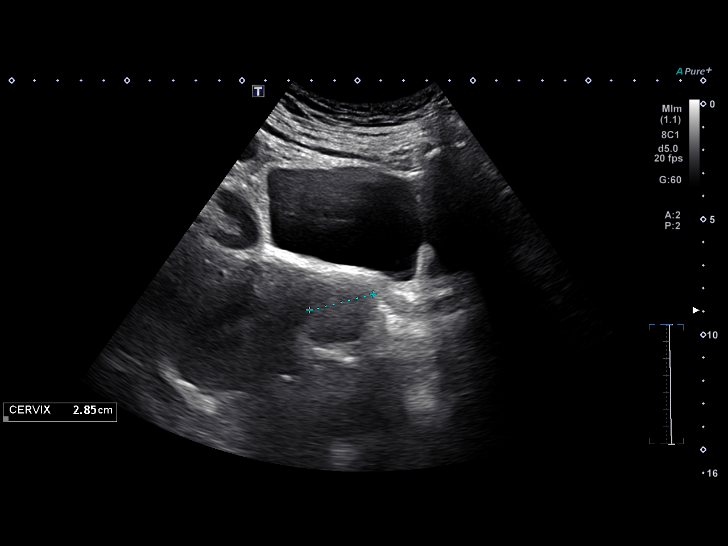
[im 4/12]
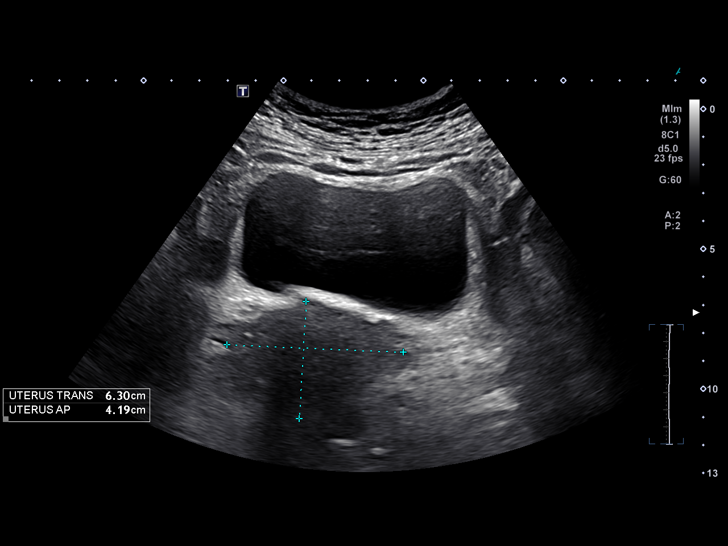
[im 5/12]
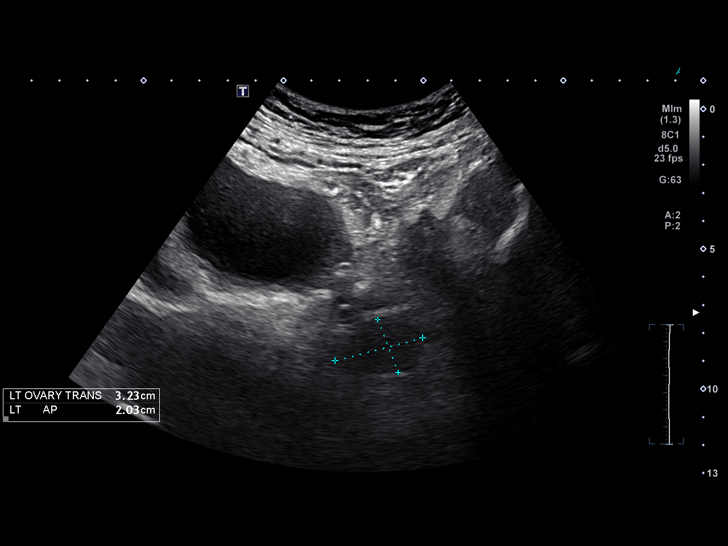
[im 6/12]
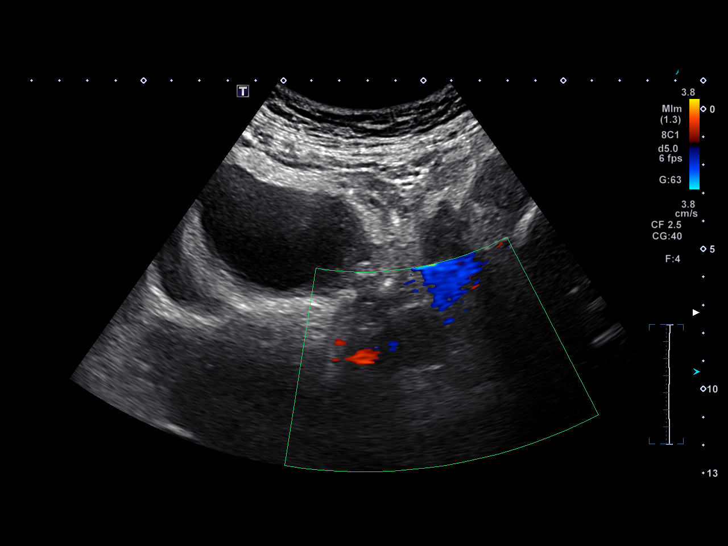
[im 7/12]
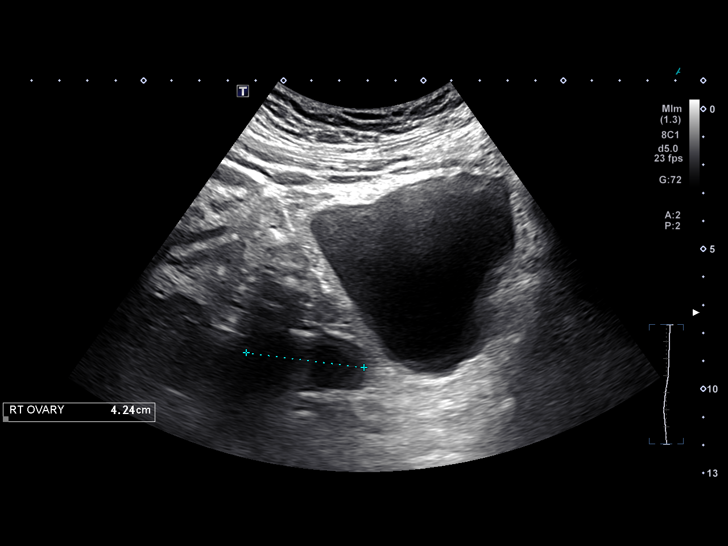
[im 8/12]
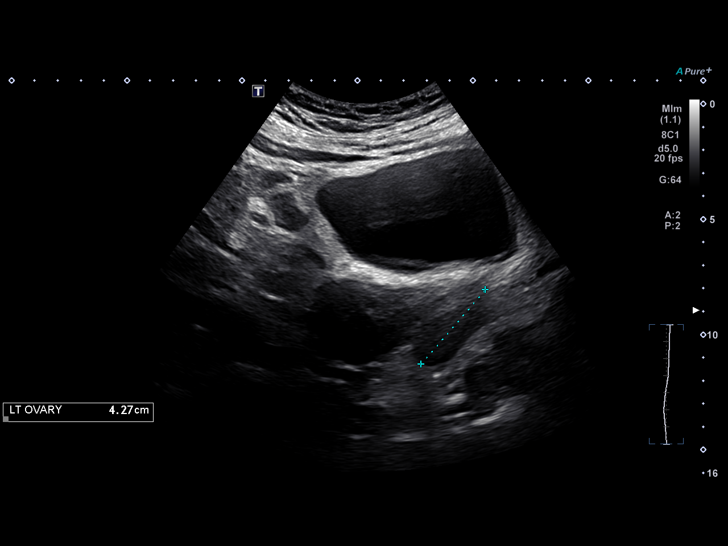
[im 9/12]
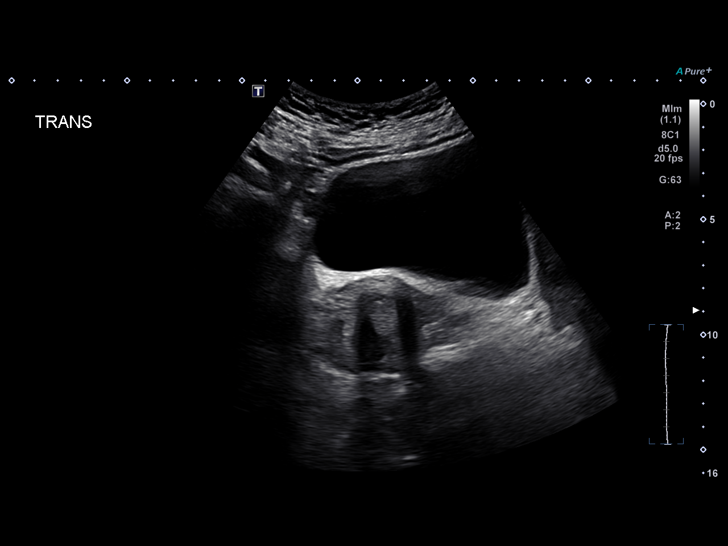
[im 10/12]
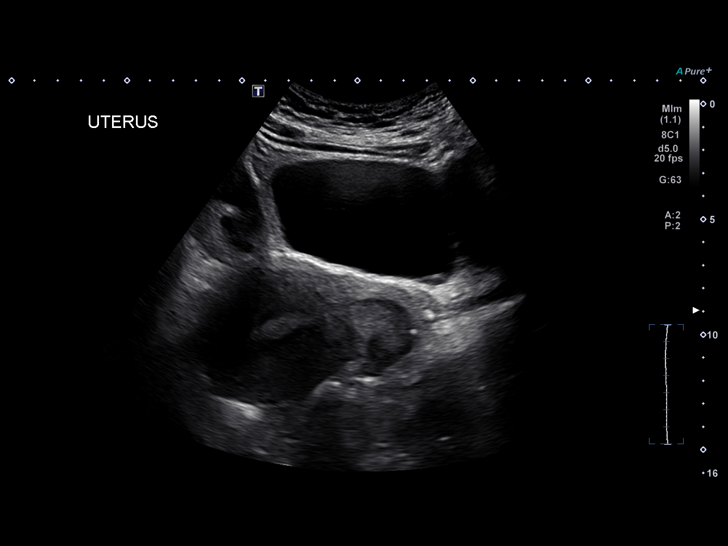
[im 11/12]
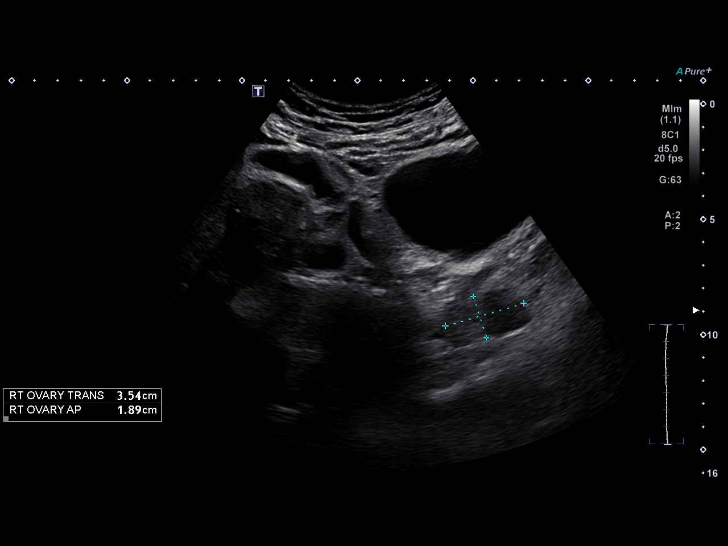
[im 12/12]
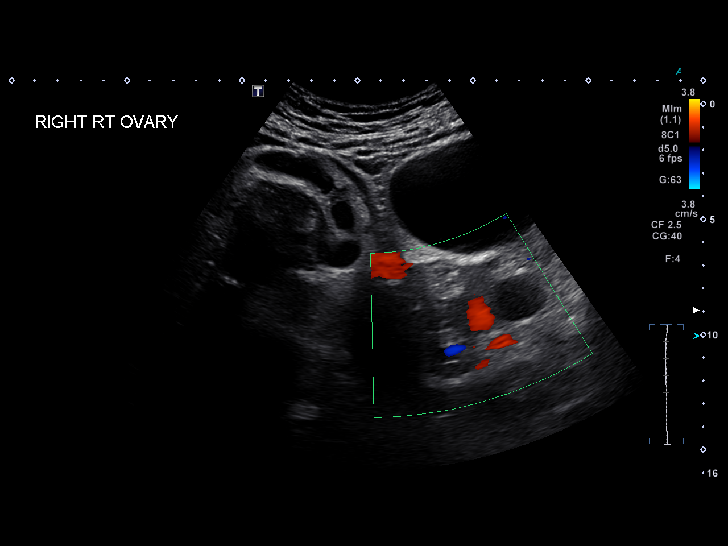

[12 of 12 positions shown; findings below may reference images not displayed]

FINDINGS: Uterus measures 4.2 cm AP x 6.3 cm transversely x 9.3 cm in
length. No uterine masses identified.

Endometrium measures 9 mm in thickness, with no endometrial
fluid identified.

No free pelvic fluid identified.

Right ovary measures 1.9 x 3.5 x 4.2 cm. Small follicular
cyst present. Ovarian blood flow demonstrated.

Left ovary measures 2.0 x 3.2 x 4.2 cm. Ovarian blood flow
demonstrated.
IMPRESSION: Essentially unremarkable pelvic ultrasound.

## 2021-05-28 IMAGING — DX XR chest 1V portable
1 series · 1 of 1 positions shown · non-contrast
Comparison: No relevant comparison study available.

Procedure(s): XR chest 1V portable

PORTABLE AP CHEST RADIOGRAPH
INDICATION: Chest pain.

[chest ap]
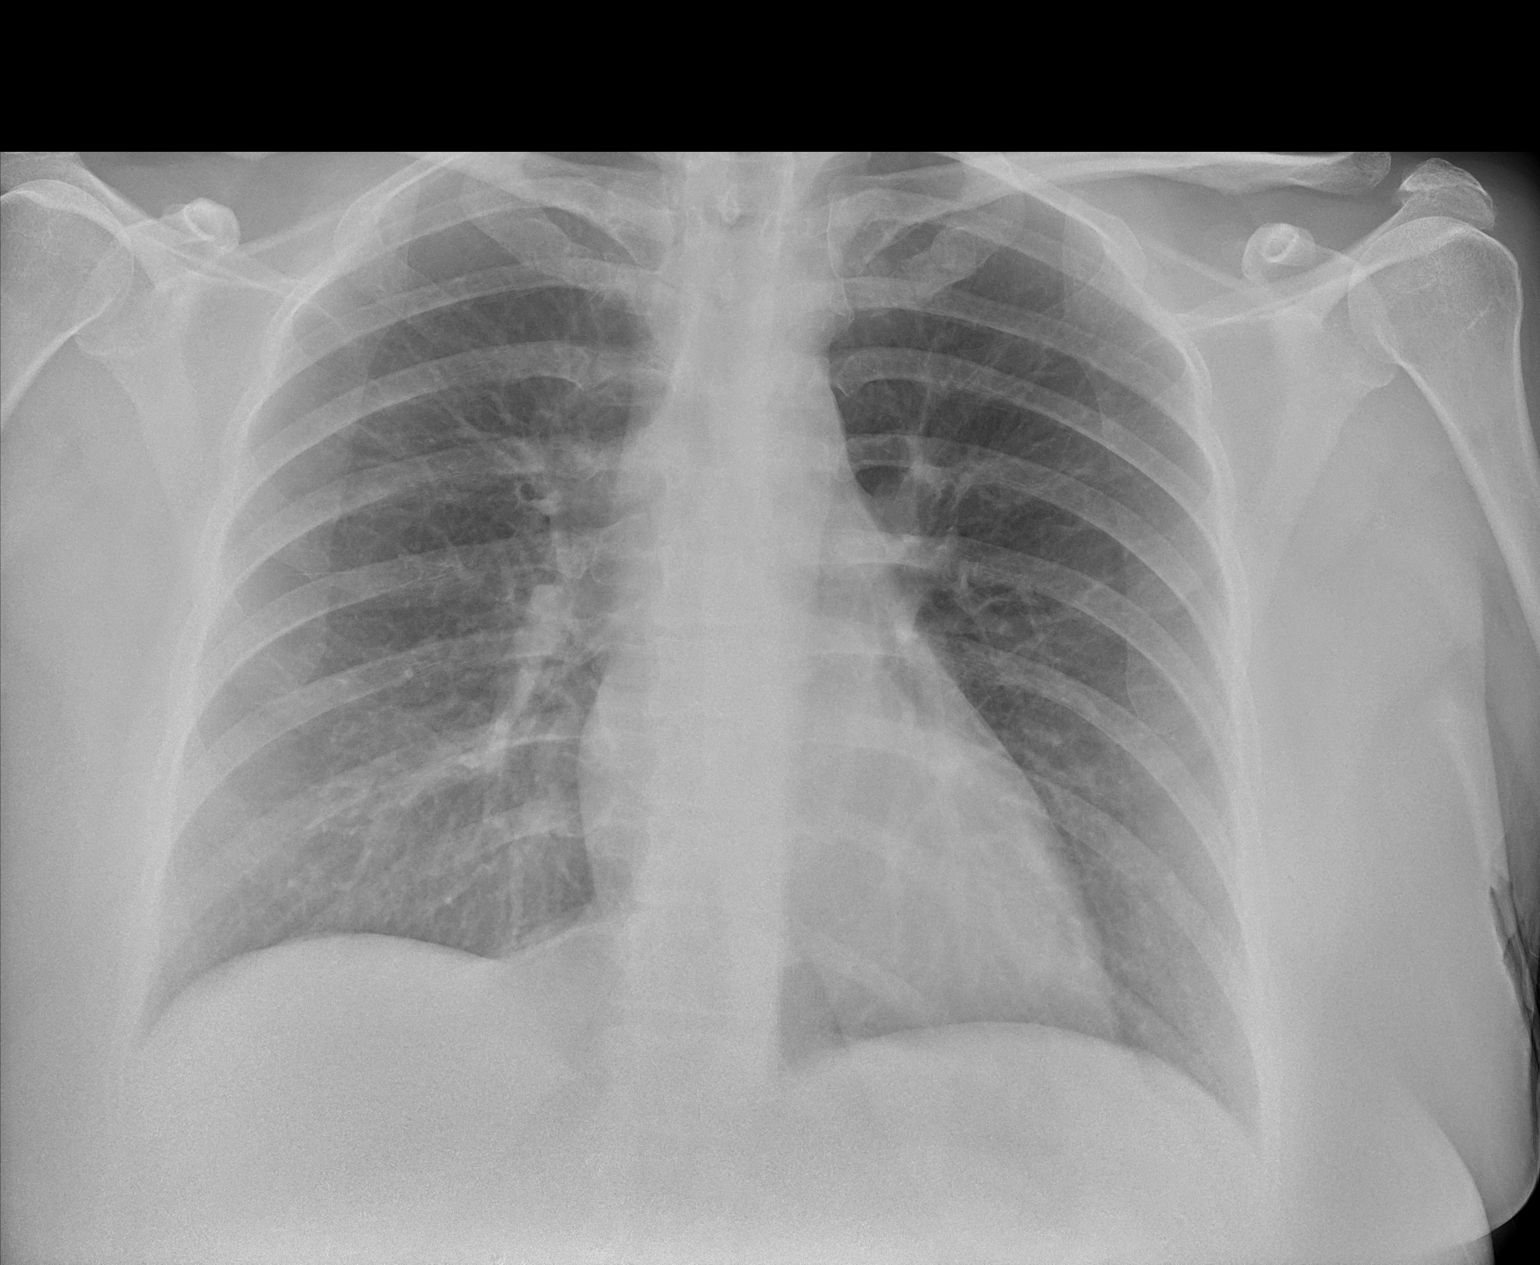

[1 of 1 positions shown; findings below may reference images not displayed]

FINDINGS: Heart size appears normal for an AP portable technique. No
evidence for CHF.

No focal or consolidative pulmonary opacities.

No pleural effusion or pneumothorax.

The visualized osseous structures are unremarkable.
IMPRESSION: No acute cardiopulmonary process.

## 2021-10-07 IMAGING — DX Humerus, Elbow & Forearm
3 series · 3 of 3 positions shown · non-contrast
Comparison: none available.

Procedure(s): XR elbow LT min 3V

EXAMINATION: Left elbow minimum 3 views.
HISTORY: Left elbow pain.

[AP (1 of 2)]
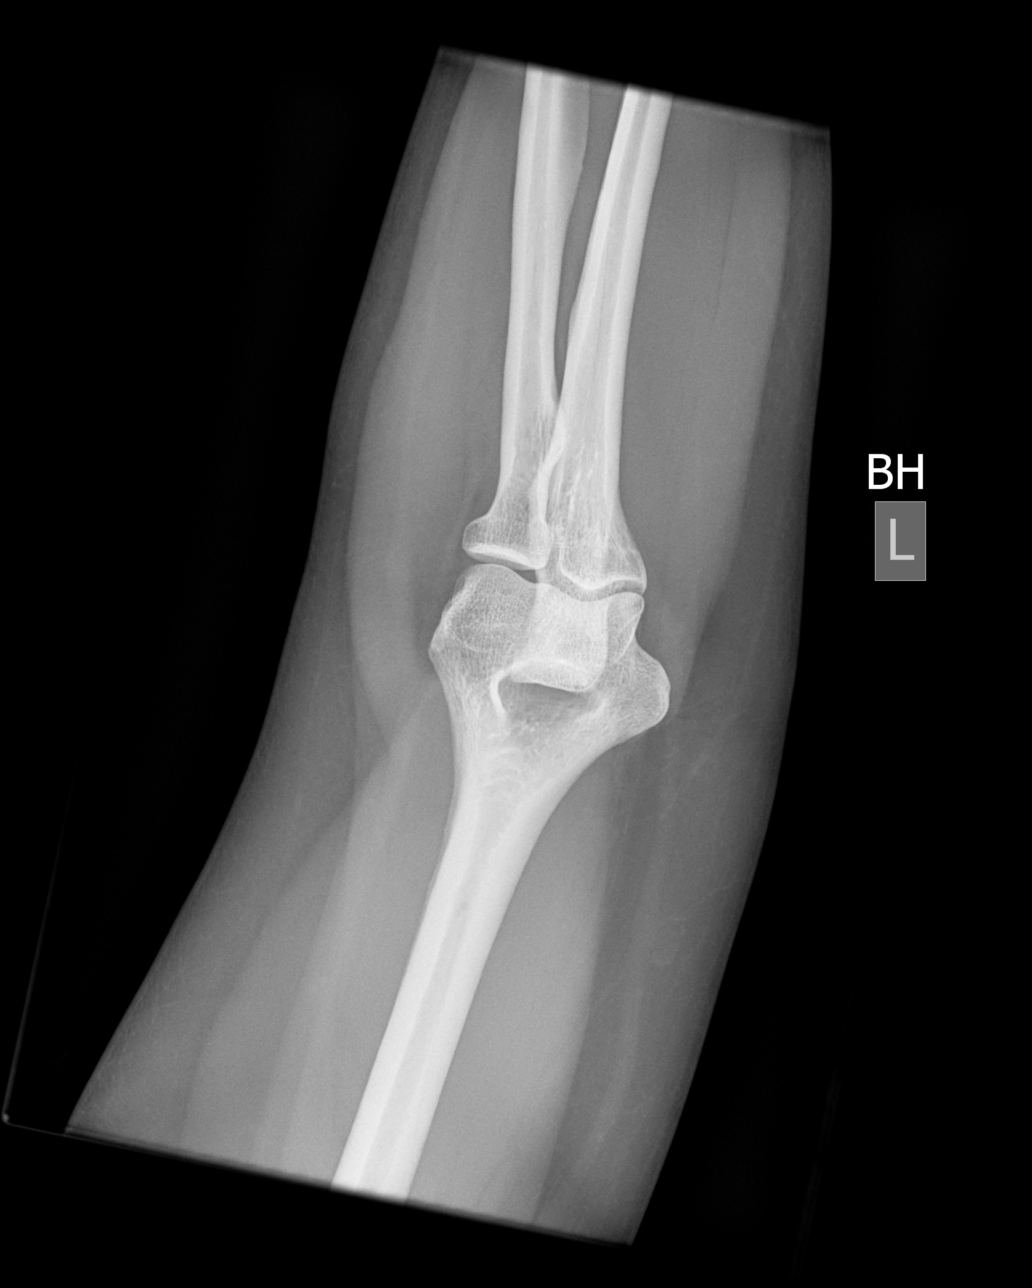

[AP (2 of 2)]
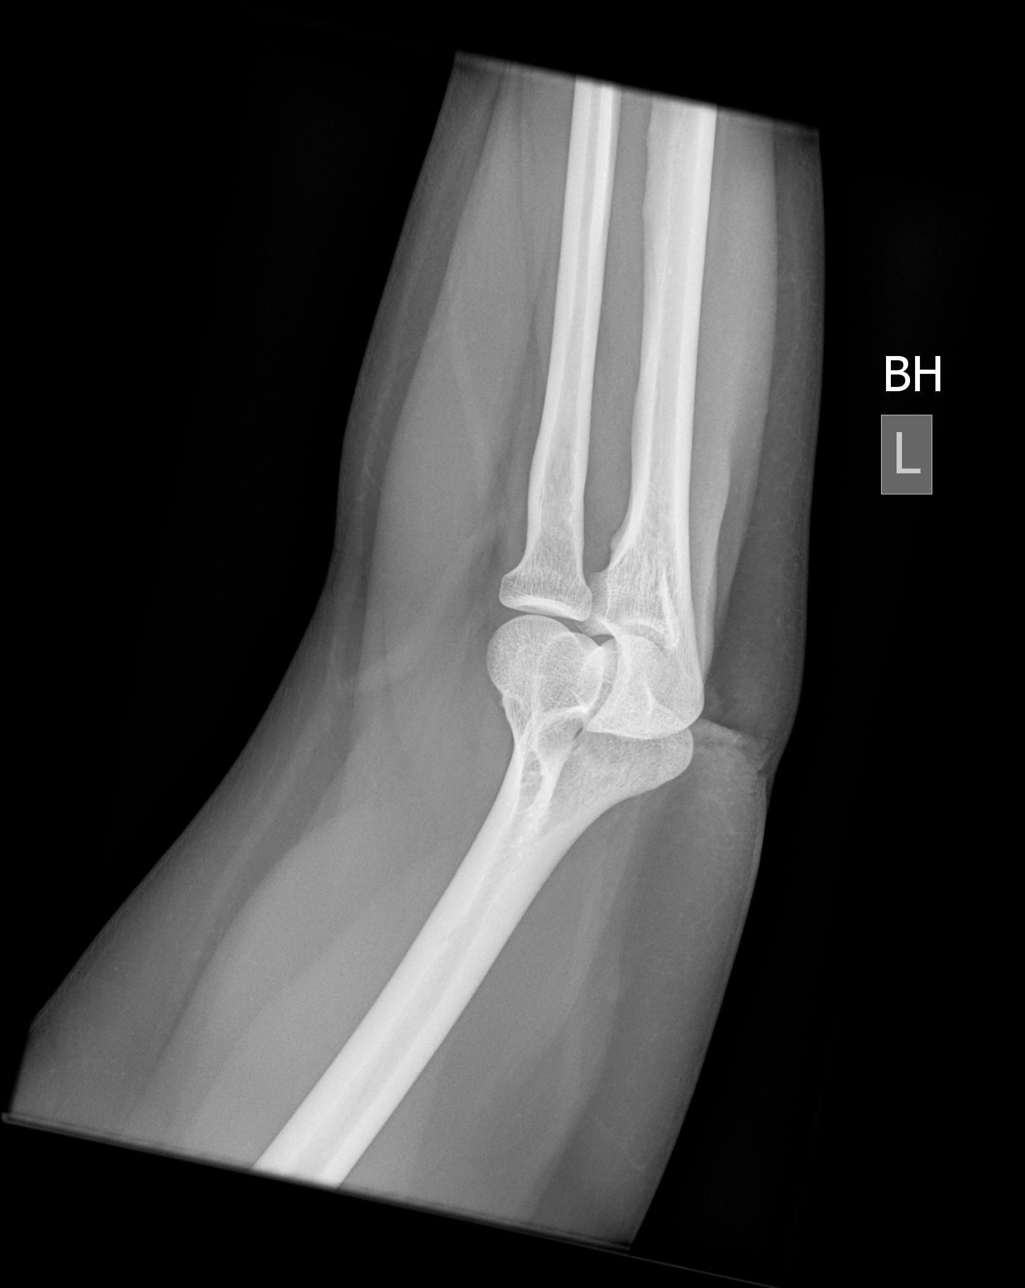

[left lateral]
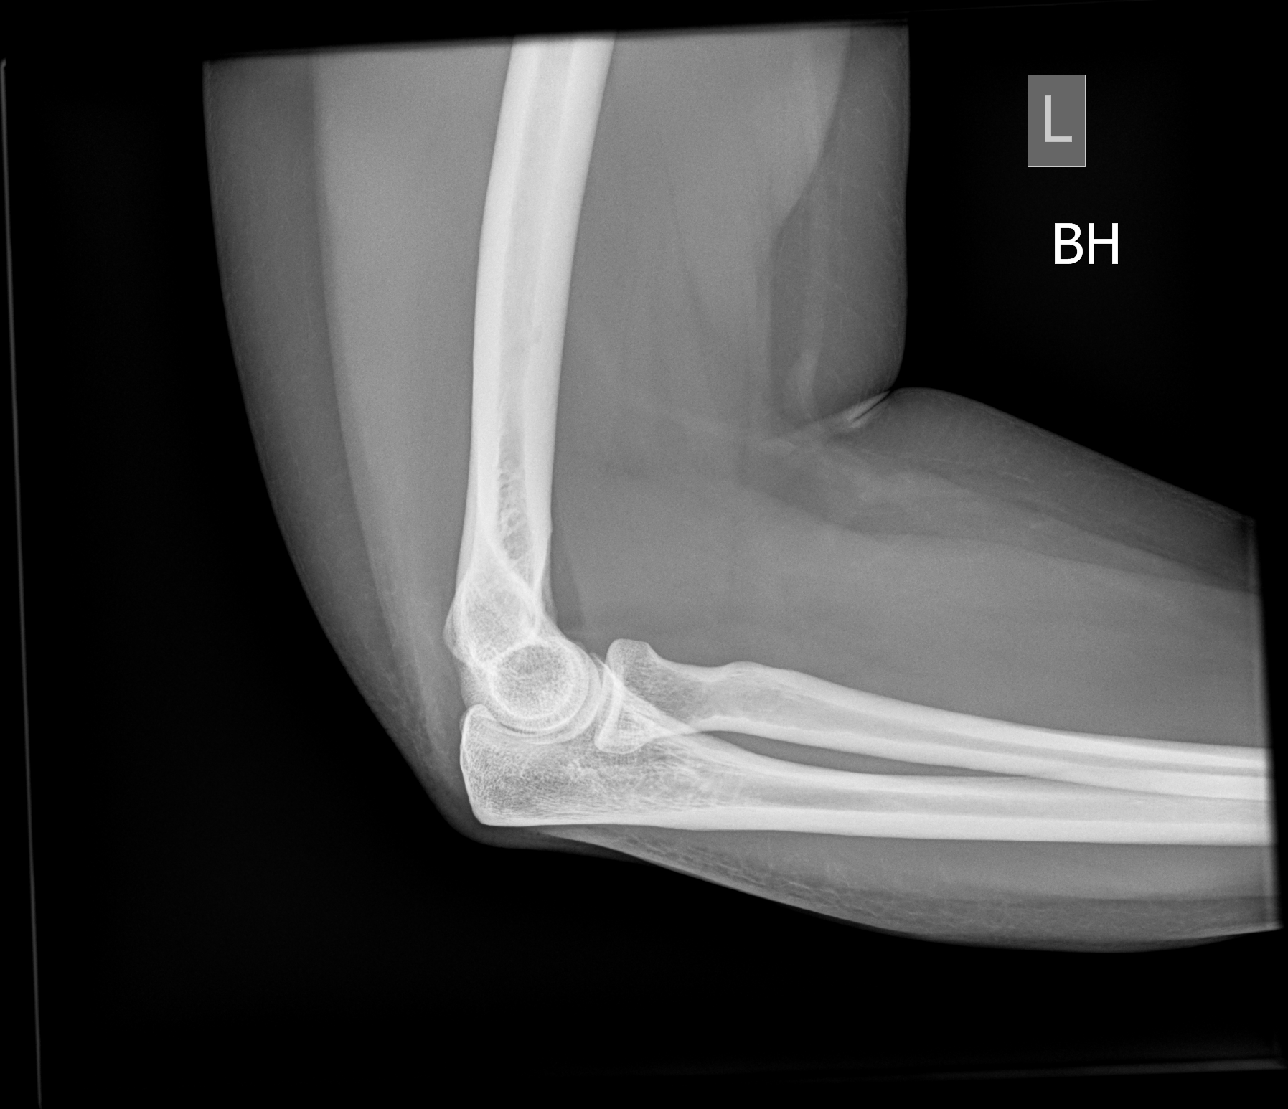

[3 of 3 positions shown; findings below may reference images not displayed]

FINDINGS: Three views of the left  elbow were obtained. Bone
alignment and
mineralization are normal.  No acute fracture, dislocation,
or subluxation is
seen. There is no elbow effusion.  The joint spaces are
normal. There is mild
dorsal soft tissue edema at the level of the olecranon which
is nonspecific.
There are no radiopaque foreign bodies within the soft
tissues. There is no soft
tissue gas.
IMPRESSION: 1. No acute osseous abnormality of the left elbow. No elbow
effusion.

2. Mild nonspecific dorsal soft tissue edema at the level of
the olecranon.
Correlate for a direct soft tissue contusion versus mild
olecranon bursitis.

## 2022-01-06 IMAGING — MR MR cervical spine wo con
4 of 9 series · 19 of 48 positions shown · non-contrast
Comparison: No relevant comparison study is available.

Procedure(s): MR cervical spine wo con

MRI OF THE CERVICAL SPINE WITHOUT CONTRAST
INDICATION: Neck pain
TECHNIQUE: Multiplanar, multisequence MRI of the cervical
spine without
intravenous contrast.

[Series 2: T2 · sagittal · 3.0mm · 0.39mm/px · 3 of 14 slices shown (1 of 4)]
[im 1/14]
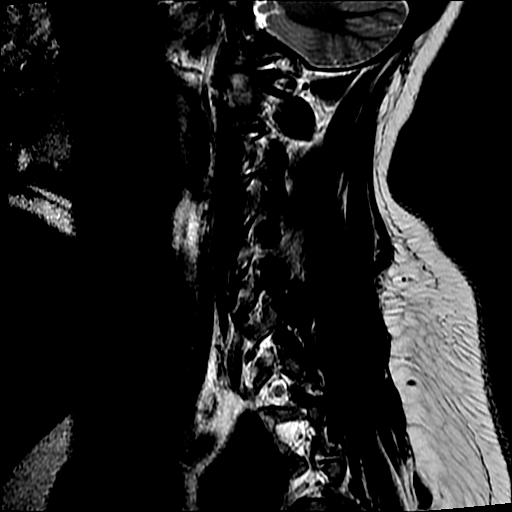
[im 7/14]
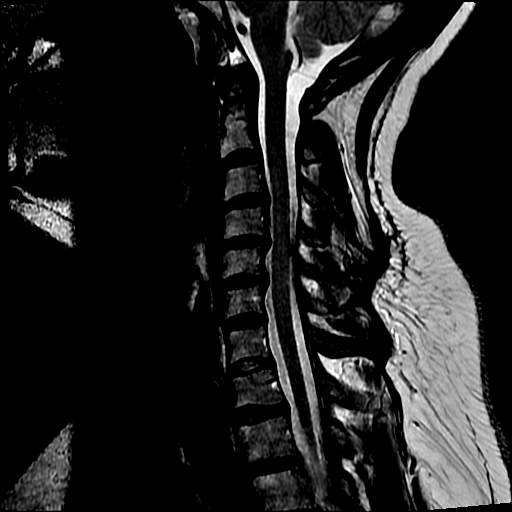
[im 14/14]
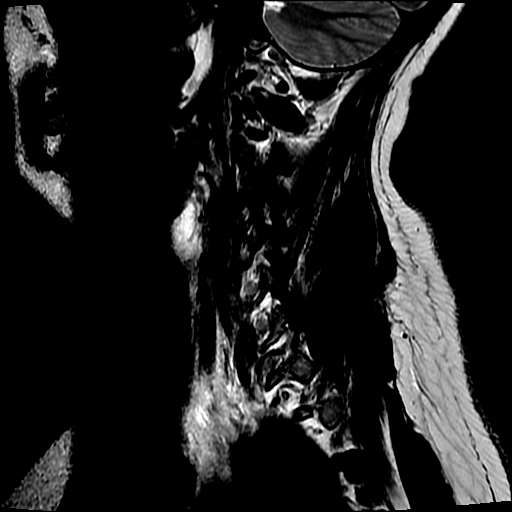

[Series 5: T2 · sagittal · 2.0mm · 0.35mm/px · 8 of 56 slices shown (2 of 4)]
[im 1/56]
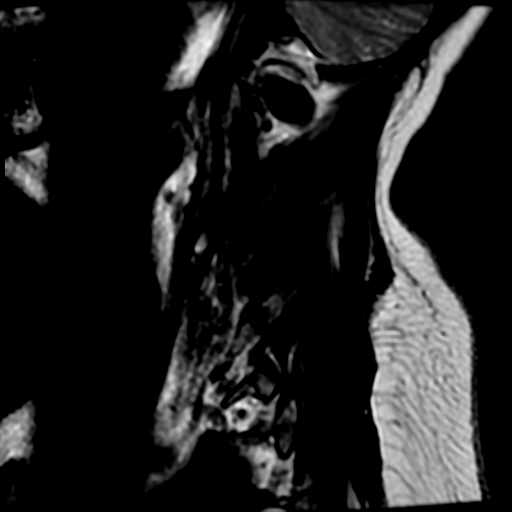
[im 8/56]
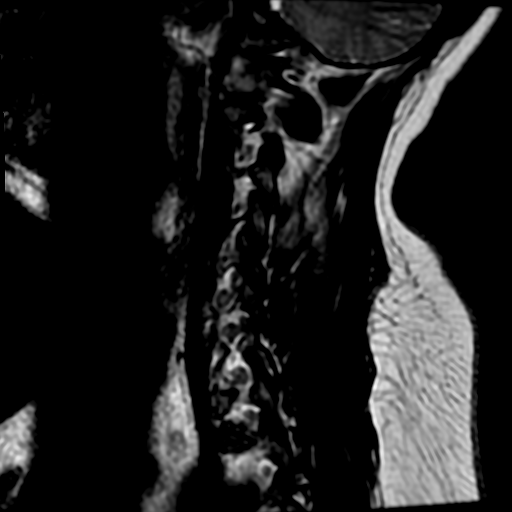
[im 16/56]
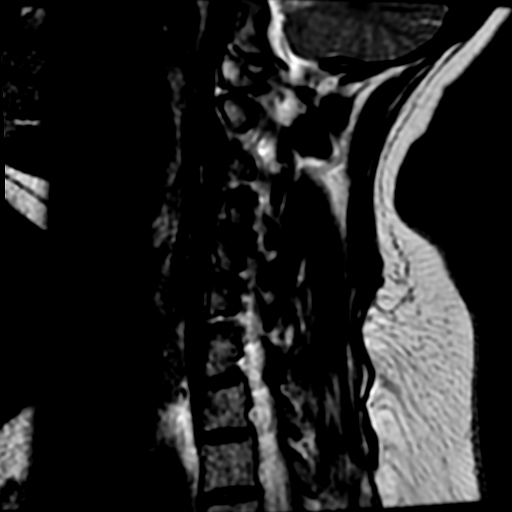
[im 24/56]
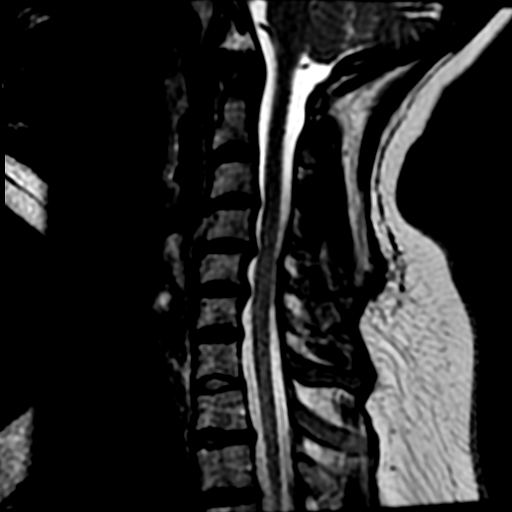
[im 32/56]
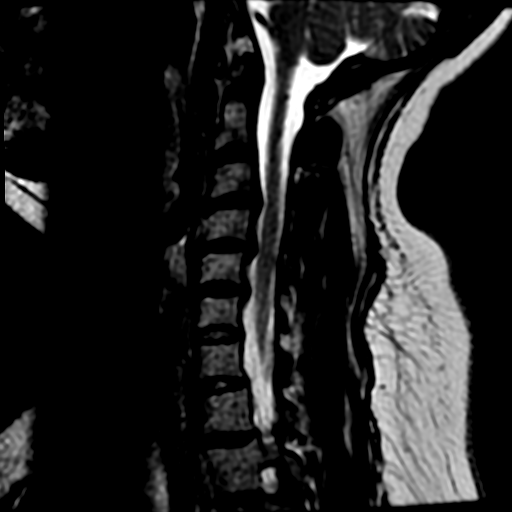
[im 40/56]
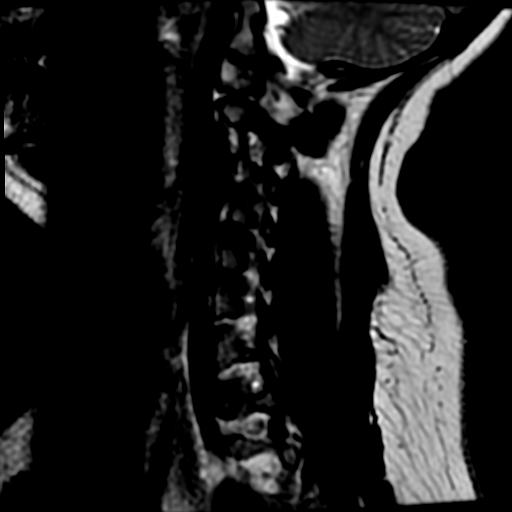
[im 48/56]
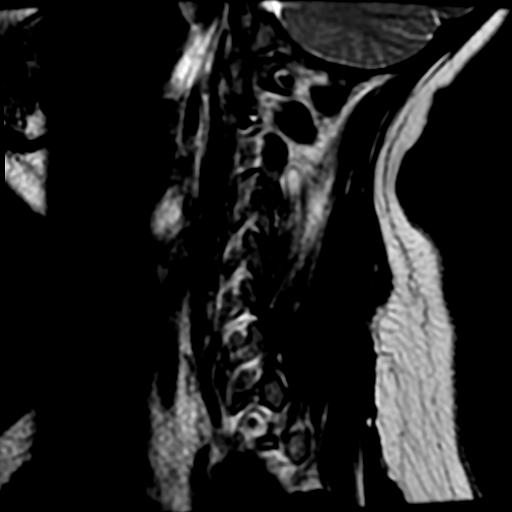
[im 56/56]
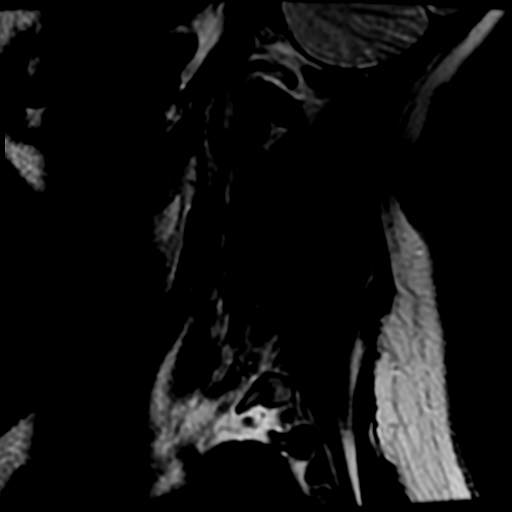

[Series 6: T2 · axial · 3.0mm · 0.35mm/px · z∈[-68,+33]mm · 4 of 31 slices shown (3 of 4)]
[im 1/31]
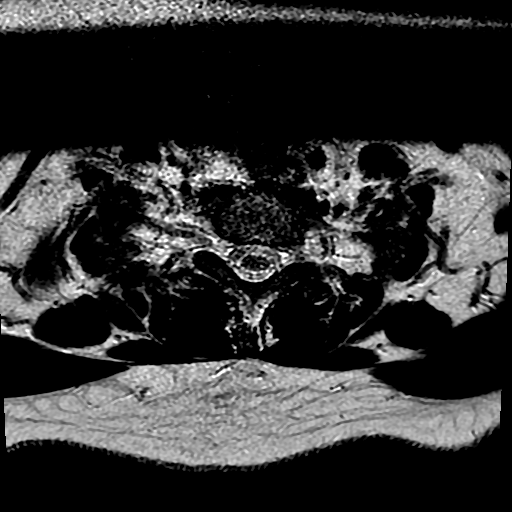
[im 11/31]
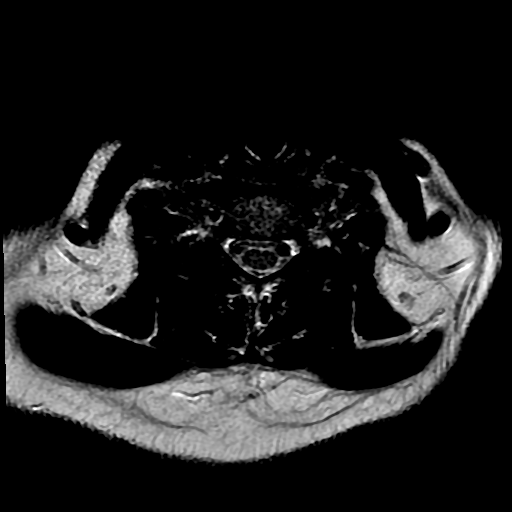
[im 21/31]
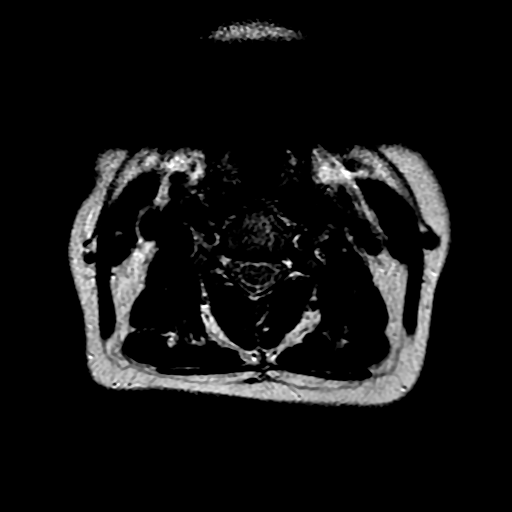
[im 31/31]
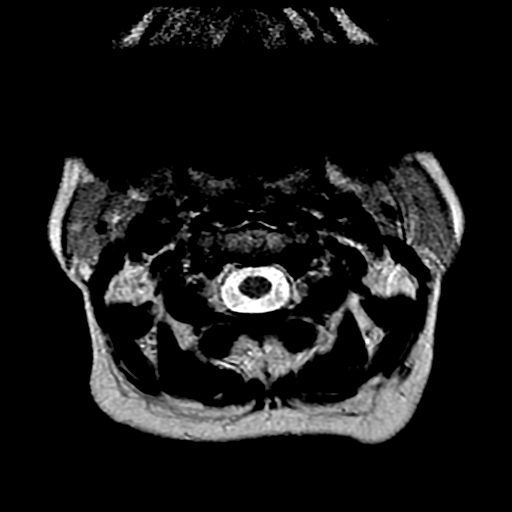

[Series 500: T2 · axial · 1.5mm · 0.35mm/px · z∈[-91,+50]mm · 4 of 117 slices shown (4 of 4)]
[im 8/117]
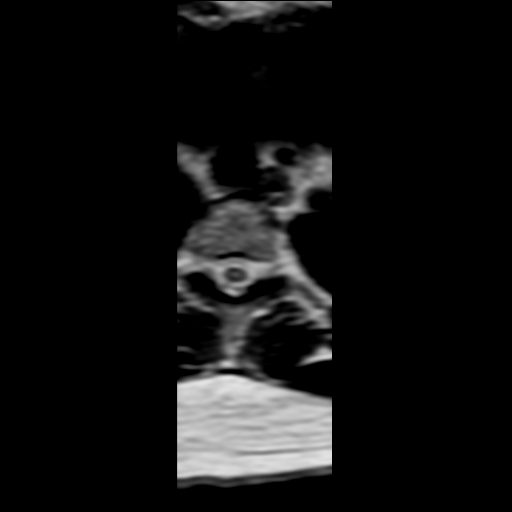
[im 16/117]
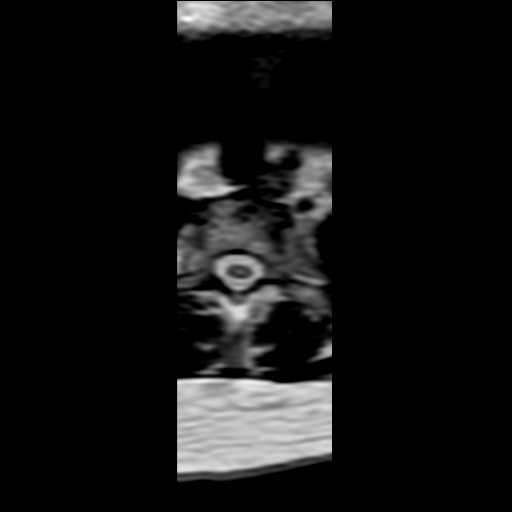
[im 62/117]
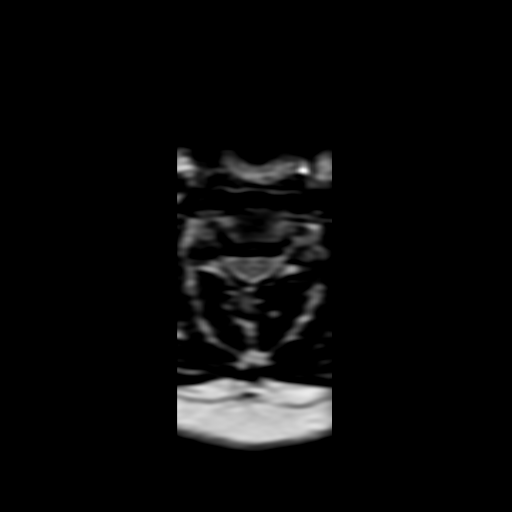
[im 101/117]
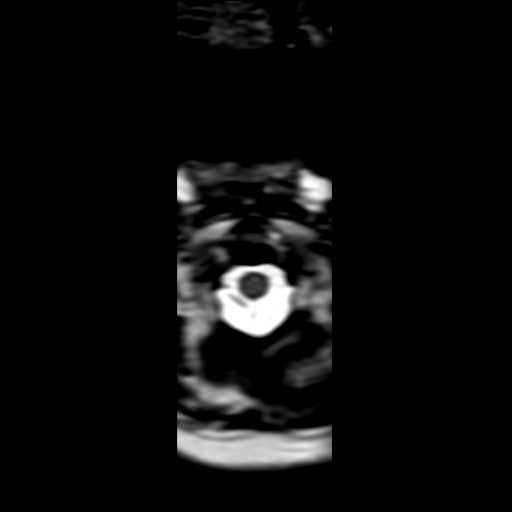

[19 of 48 positions shown; findings below may reference images not displayed]

FINDINGS: The visualized posterior fossa is normal in appearance.  The
cervical spinal
cord has normal signal intensity.  Regional bone marrow is
normal in appearance.
Slight degenerative retrolisthesis of C4 on C5. No other
abnormal listhesis.

C2-C3: No disc bulge or focal disc protrusion, central
spinal canal stenosis, or
neural foraminal stenosis.

C3-C4: Slight disc bulge, with no central spinal canal
stenosis. There is mild
bilateral uncovertebral joint hypertrophy, causing moderate
bilateral neural
foraminal stenosis.

C4-C5: Broad-based posterior disc/osteophyte complex
effacing the ventral thecal
sac and causing mild central spinal canal stenosis. It
combines with bilateral
uncovertebral joint hypertrophy to cause bilateral severe
neural foraminal
stenosis.

C5-C6: Mild broad-based disc bulge that effaces the ventral
thecal sac and
causes mild central spinal canal stenosis. Mild
uncovertebral joint hypertrophy,
with no significant neural foraminal stenosis.

C6-C7: Minimal disc bulge, with no central spinal canal
stenosis or neural
foraminal stenosis.

C7-T1: No disc bulge or focal disc protrusion, central
spinal canal stenosis, or
neural foraminal stenosis.

Upper thoracic spine: Minimal disc bulge at T1-2, with no
central spinal canal
stenosis.
IMPRESSION: 1. Mild central spinal canal stenosis and bilateral severe
neural foraminal
stenosis at C4-5.
2. Mild central spinal canal stenosis at C5-6.
3. Moderate bilateral neural foraminal stenosis at C3-4.

## 2022-05-02 IMAGING — US US carotid doppler [ID]
1 series · 14 of 16 positions shown · non-contrast
Comparison: None.

Procedure(s): US carotid doppler [id number]

DUPLEX SCAN, EXTRACRANIAL CAROTID ARTERIES, COMPLETE
BILATERAL STUDY
INDICATION: Lightheaded, TIA
TECHNIQUE: Duplex sonography to evaluate the extracranial
carotid arteries was
performed utilizing realtime grayscale, Doppler spectral
analysis, and color
flow imaging. Stenosis is calculated based on velocity
analysis according to
[facility] study of 2005. No significant stenosis is less than
50%.
Velocity Less than 125 (cm/sec)   =  0-49% Stenosis
Velocity 125-225 (cm/sec)               = 50-69% Stenosis
Velocity 226-325 (cm/sec)               = 70-89% Stenosis
Velocity greater than 326 cm/sec  = > 90% Stenosis

[Series 1: us carotid doppler (id) · 14 of 50 slices shown]
[im 1/50]
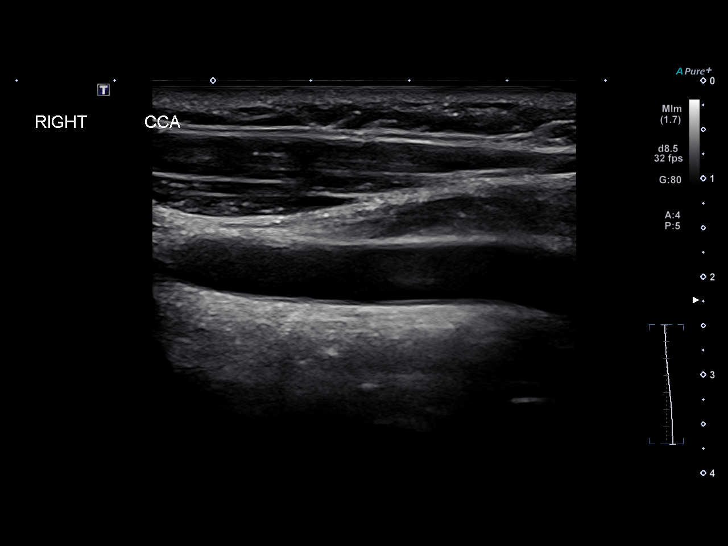
[im 4/50]
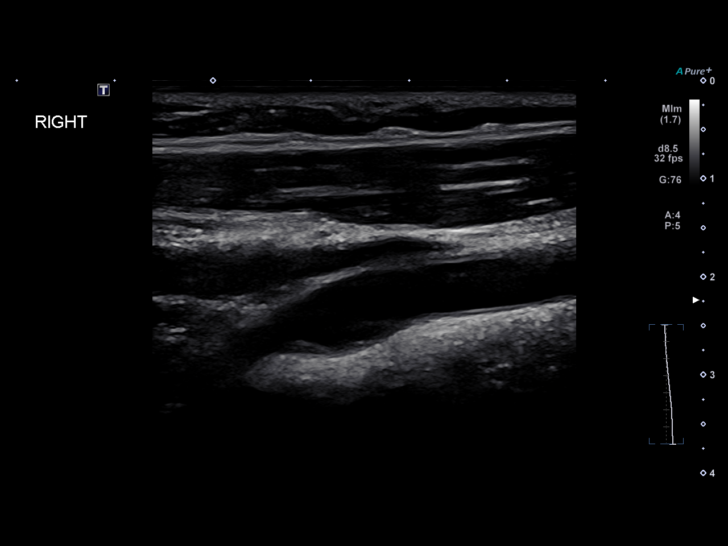
[im 7/50]
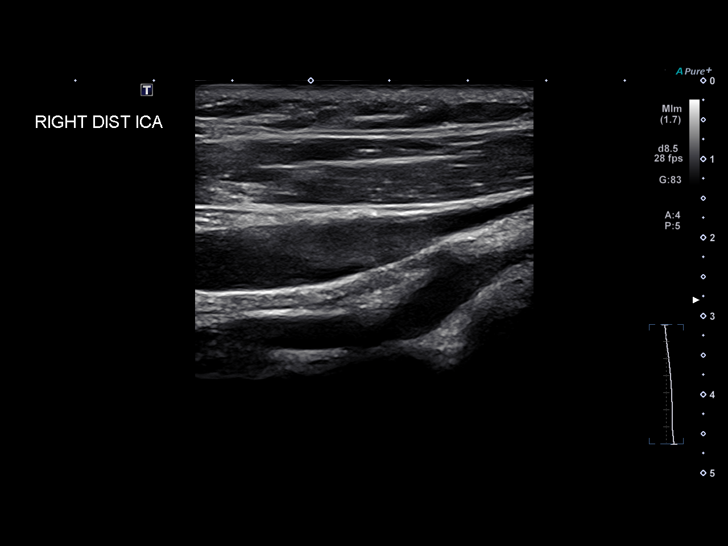
[im 14/50]
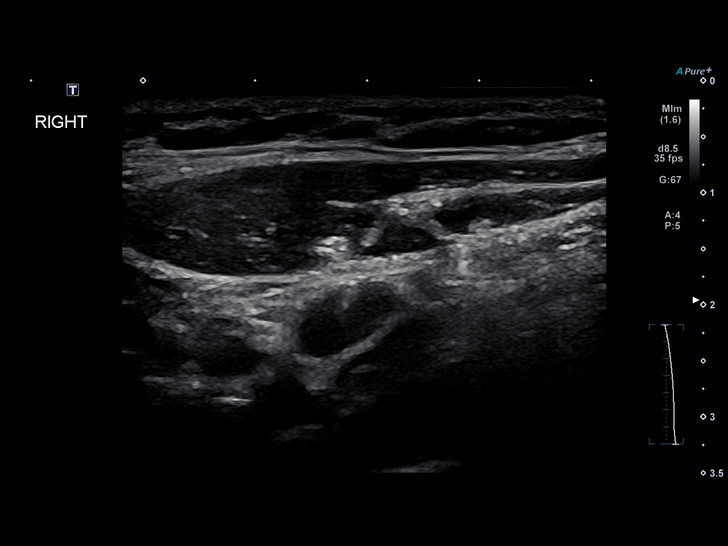
[im 17/50]
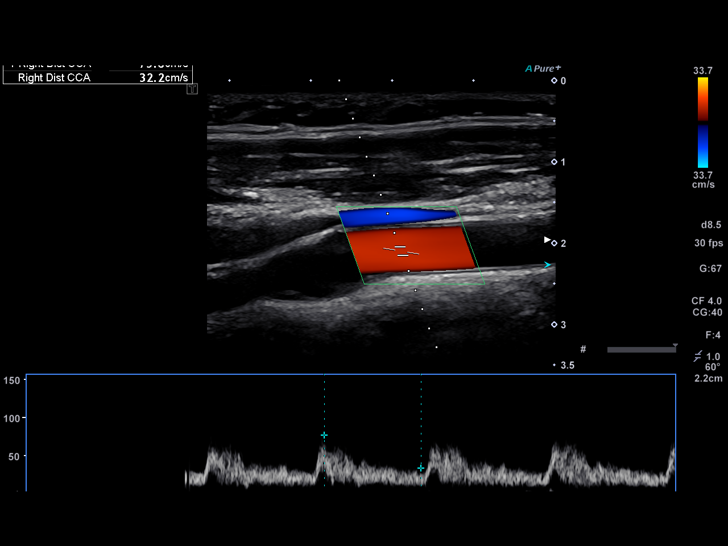
[im 20/50]
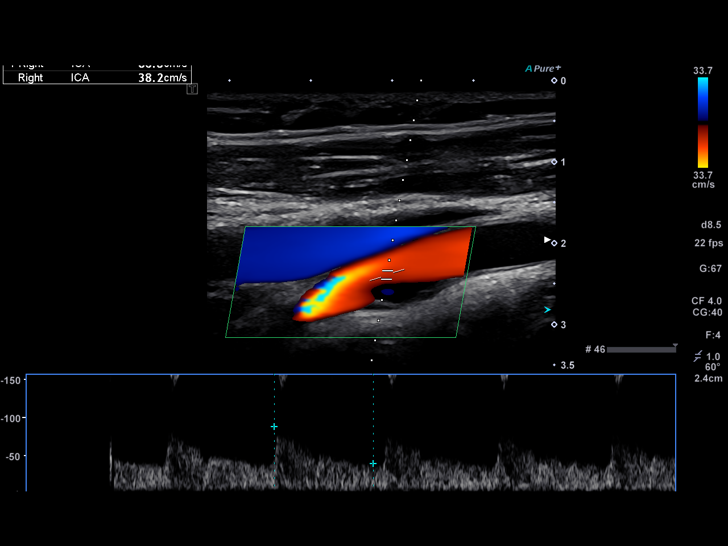
[im 23/50]
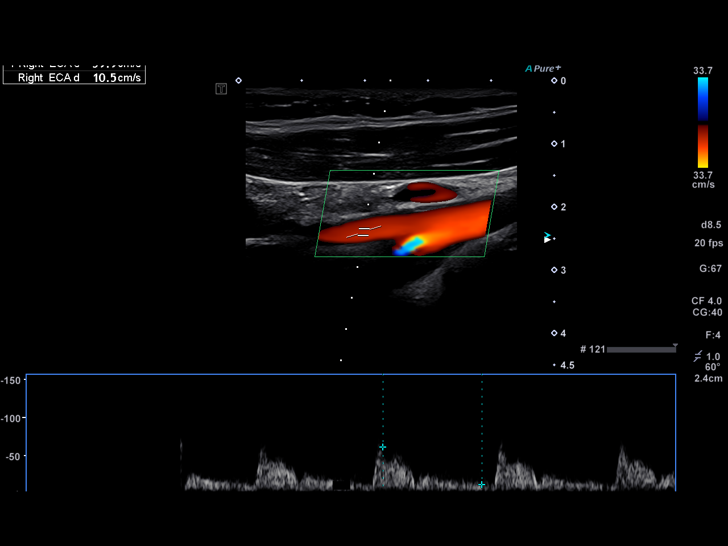
[im 27/50]
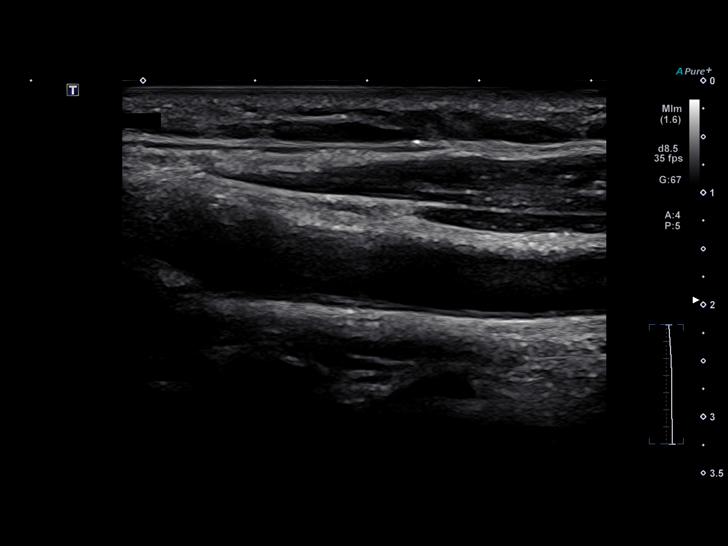
[im 30/50]
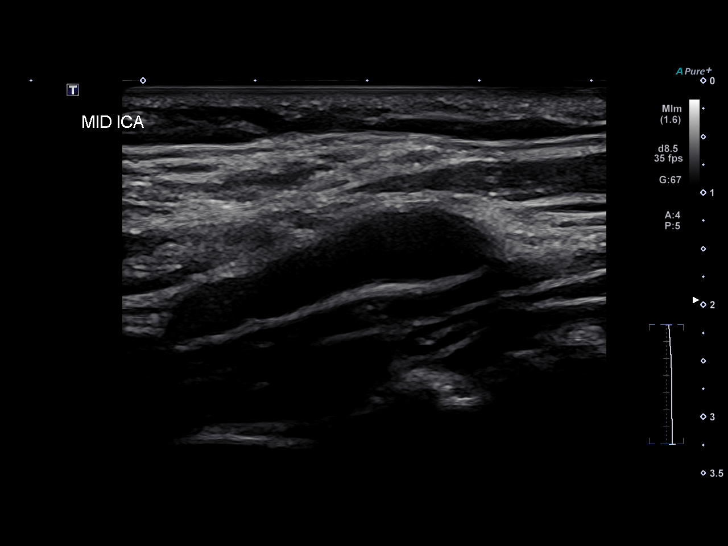
[im 33/50]
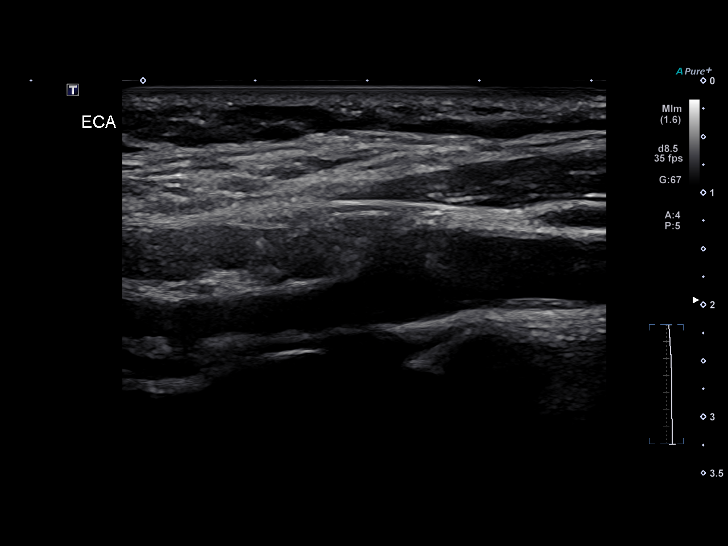
[im 40/50]
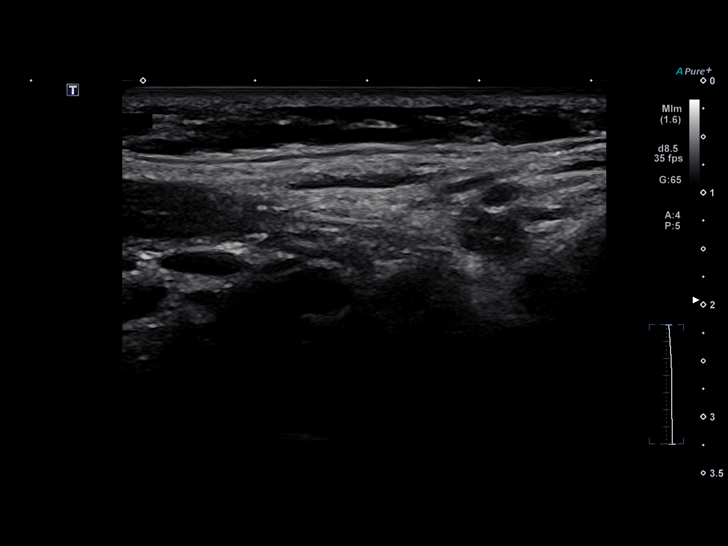
[im 43/50]
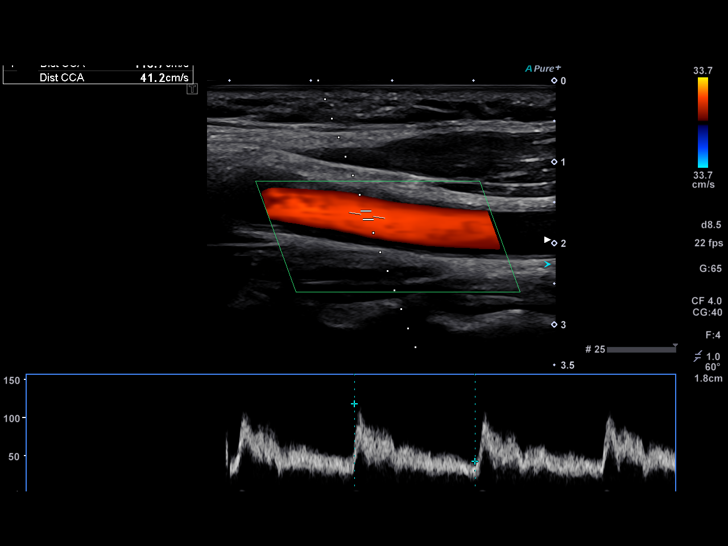
[im 46/50]
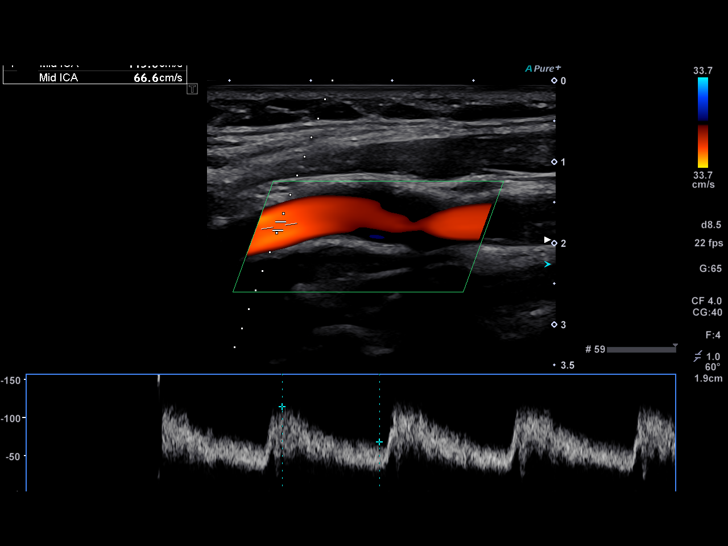
[im 50/50]
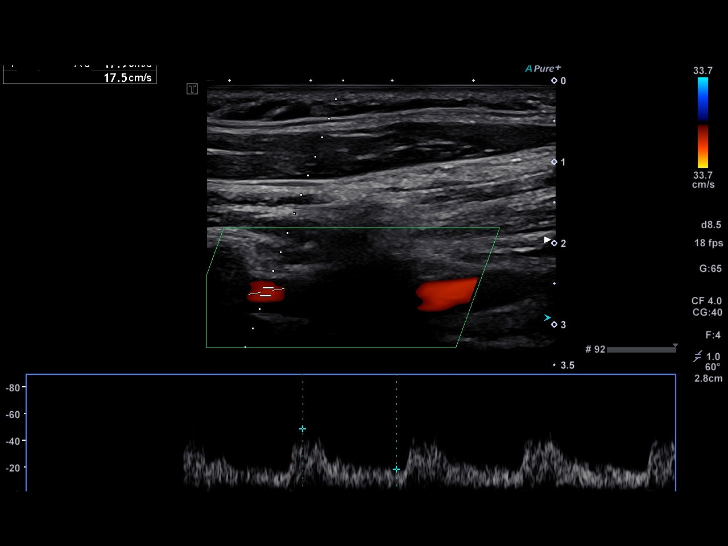

[14 of 16 positions shown; findings below may reference images not displayed]

FINDINGS: Grayscale images: No significant plaque in either carotid
bulb.

ICA to CCA ratio:  Right
ICA to CCA ratio: Left

Peak R ICA Velocity: 130 cm/sec

Peak L ICA Velocity: 127 cm/sec

Vertebral arteries: Antegrade bilaterally
IMPRESSION: 1.  Borderline velocities in the internal carotid arteries
however no
abnormality of the ratio. Findings are consistent with no
hemodynamically
significant stent
